# Patient Record
Sex: Female | Born: 1955
Health system: Southern US, Community
[De-identification: ages and names within clinical notes are randomized; demographics above are authoritative.]

## PROBLEM LIST (undated history)

## (undated) DIAGNOSIS — G4700 Insomnia, unspecified: Secondary | ICD-10-CM

## (undated) DIAGNOSIS — E785 Hyperlipidemia, unspecified: Secondary | ICD-10-CM

## (undated) DIAGNOSIS — I10 Essential (primary) hypertension: Secondary | ICD-10-CM

## (undated) HISTORY — PX: ABDOMINAL HYSTERECTOMY: SHX81

## (undated) HISTORY — PX: BACK SURGERY: SHX140

---

## 2005-07-31 ENCOUNTER — Emergency Department: Payer: Self-pay | Admitting: Emergency Medicine

## 2006-11-19 ENCOUNTER — Emergency Department: Payer: Self-pay | Admitting: Emergency Medicine

## 2007-10-08 ENCOUNTER — Emergency Department: Payer: Self-pay | Admitting: Emergency Medicine

## 2008-11-18 ENCOUNTER — Emergency Department: Payer: Self-pay | Admitting: Emergency Medicine

## 2008-12-09 ENCOUNTER — Observation Stay: Payer: Self-pay | Admitting: Internal Medicine

## 2009-01-06 ENCOUNTER — Ambulatory Visit: Payer: Self-pay | Admitting: Internal Medicine

## 2009-01-26 ENCOUNTER — Ambulatory Visit: Payer: Self-pay | Admitting: Internal Medicine

## 2009-09-09 ENCOUNTER — Ambulatory Visit: Payer: Self-pay | Admitting: Internal Medicine

## 2009-11-03 ENCOUNTER — Emergency Department: Payer: Self-pay | Admitting: Emergency Medicine

## 2010-04-27 ENCOUNTER — Ambulatory Visit: Payer: Self-pay | Admitting: Internal Medicine

## 2011-06-17 ENCOUNTER — Emergency Department: Payer: Self-pay | Admitting: Emergency Medicine

## 2011-08-15 ENCOUNTER — Emergency Department: Payer: Self-pay | Admitting: Emergency Medicine

## 2011-08-15 LAB — CBC
HCT: 42.6 % (ref 35.0–47.0)
HGB: 14.3 g/dL (ref 12.0–16.0)
MCHC: 33.7 g/dL (ref 32.0–36.0)
MCV: 92 fL (ref 80–100)
RBC: 4.63 10*6/uL (ref 3.80–5.20)
RDW: 13.7 % (ref 11.5–14.5)

## 2011-08-15 LAB — COMPREHENSIVE METABOLIC PANEL
Albumin: 4 g/dL (ref 3.4–5.0)
Alkaline Phosphatase: 72 U/L (ref 50–136)
BUN: 10 mg/dL (ref 7–18)
Calcium, Total: 9.2 mg/dL (ref 8.5–10.1)
Glucose: 107 mg/dL — ABNORMAL HIGH (ref 65–99)
SGOT(AST): 18 U/L (ref 15–37)
Total Protein: 7.4 g/dL (ref 6.4–8.2)

## 2015-08-15 ENCOUNTER — Ambulatory Visit
Admission: EM | Admit: 2015-08-15 | Discharge: 2015-08-15 | Disposition: A | Discharge status: Home / Self Care | Payer: BLUE CROSS/BLUE SHIELD | Attending: Family Medicine | Admitting: Family Medicine

## 2015-08-15 ENCOUNTER — Encounter: Payer: Self-pay | Admitting: Gynecology

## 2015-08-15 DIAGNOSIS — K0889 Other specified disorders of teeth and supporting structures: Secondary | ICD-10-CM

## 2015-08-15 DIAGNOSIS — K047 Periapical abscess without sinus: Secondary | ICD-10-CM | POA: Diagnosis not present

## 2015-08-15 HISTORY — DX: Hyperlipidemia, unspecified: E78.5

## 2015-08-15 HISTORY — DX: Insomnia, unspecified: G47.00

## 2015-08-15 HISTORY — DX: Essential (primary) hypertension: I10

## 2015-08-15 MED ORDER — PENICILLIN V POTASSIUM 500 MG PO TABS: 500.0000 mg | ORAL_TABLET | Freq: Four times a day (QID) | ORAL | Status: DC

## 2015-08-15 MED ORDER — LIDOCAINE VISCOUS 2 % MT SOLN
15.0000 mL | Freq: Once | OROMUCOSAL | Status: AC
  Administered 2015-08-15: 15 mL via OROMUCOSAL

## 2015-08-15 MED ORDER — IBUPROFEN 600 MG PO TABS: 600.0000 mg | ORAL_TABLET | Freq: Three times a day (TID) | ORAL | Status: DC | PRN

## 2015-08-15 MED ORDER — OXYCODONE-ACETAMINOPHEN 5-325 MG PO TABS: 1.0000 | ORAL_TABLET | Freq: Three times a day (TID) | ORAL | Status: DC | PRN

## 2015-08-15 NOTE — ED Provider Notes (Signed)
[facility]   Time seen: Approximately 11:08 AM  I have reviewed the triage vital signs and the nursing notes.   HISTORY  Chief Complaint Facial Swelling   HPI Julie Maldonado is a 60 y.o. female presents with a complaint of right lower dental pain. Patient reports that she has had right lower dental pain times approximately 1 week. Reports history of similar. Reports that one week ago she was playing with her dog and states that her dog jumped up and its head hit her right lower jaw causing a tooth that had cavity and fillings to chip off more. Patient denies other pain. Denies bony pain. Denies loss of consciousness.Denies pain with opening mouth or eating.  Patient reports that she has had continued pain throughout the week at tooth however states that she woke up this morning swelling to her face around the tooth. Patient reports that the pain is continued about the same level. States current right lower dental pain is 7 out of 10 aching and throbbing. Denies pain radiation. Denies difficulty eating or swallowing. Denies other swelling. Denies fevers. Denies other recent changes.     Past Medical History  Diagnosis Date  . Hyperlipemia   . Hypertension   . Insomnia     There are no active problems to display for this patient.   Past Surgical History  Procedure Laterality Date  . Abdominal hysterectomy    . Back surgery      Current Outpatient Rx  Name  Route  Sig  Dispense  Refill  . aspirin 81 MG tablet   Oral   Take 81 mg by mouth daily.         Marland Kitchen atorvastatin (LIPITOR) 10 MG tablet   Oral   Take 10 mg by mouth daily.         Lisinopril   Allergies Sulfa antibiotics  No family history on file.  Social History Social History  Substance Use Topics  . Smoking status: Current Every Day Smoker  . Smokeless tobacco: None  . Alcohol Use: No    Review of Systems Constitutional: No fever/chills Eyes: No visual changes. ENT: No sore throat. Right  lower dental pain and right facial swelling. Cardiovascular: Denies chest pain. Respiratory: Denies shortness of breath. Gastrointestinal: No abdominal pain.  No nausea, no vomiting.  No diarrhea.  No constipation. Genitourinary: Negative for dysuria. Musculoskeletal: Negative for back pain. Skin: Negative for rash. Neurological: Negative for headaches, focal weakness or numbness.  10-point ROS otherwise negative. ________________________________________   PHYSICAL EXAM:  VITAL SIGNS: ED Triage Vitals  Enc Vitals Group     BP 08/15/15 0957 151/97 mmHg     Pulse Rate 08/15/15 0957 87     Resp 08/15/15 0957 16     Temp 08/15/15 0957 98.1 F (36.7 C)     Temp Source 08/15/15 0957 Oral     SpO2 08/15/15 0957 99 %     Weight 08/15/15 0957 155 lb (70.308 kg)     Height 08/15/15 0957 5\' 5"  (1.651 m)     Head Cir --      Peak Flow --      Pain Score --      Pain Loc --      Pain Edu? --      Excl. in GC? --    Patient reports that she has not yet taken her blood pressure medicines this morning,  states will take [place] as she returns home.  Constitutional: Alert and  oriented. Well appearing and in no acute distress. Eyes: Conjunctivae are normal. PERRL. EOMI. Head: Atraumatic.Mild to moderate right lower facial swelling, no erythema, no induration, no external fluctuance, no swelling induration or pain submandibular. No bony tenderness. Ears:  Bilateral ears no erythema, normal TMs.  Nose: No congestion/rhinnorhea. Mouth/Throat: Mucous membranes are moist.  Oropharynx non-erythematous. No lip, tongue or pharyngeal swelling or edema. Periodontal Exam    patient with widespread dental decay with multiple visualized on caries, dental fracture to right lower tooth #30. Patient also with generalized, erythema and edema along right lower teeth 27 through 30, slight area of fluctuance noted at base of 29 and 30. See diagram. Drinking fluids well in urgent care.  Neck: No stridor.    Hematological/Lymphatic/Immunilogical: No cervical lymphadenopathy. Cardiovascular:   Normal rate, regular rhythm. Grossly normal heart sounds. Good peripheral circulation. Respiratory: Normal respiratory effort.  No retractions. No wheezes, rales or rhonchi.  Musculoskeletal: No lower or upper extremity tenderness nor edema.  No joint effusions. no cervical, thoracic or lumbar tenderness to palpation.  Neurologic:  Normal speech and language. No gross focal neurologic deficits are appreciated. Speech is normal. No gait instability. Skin:  Skin is warm, dry and intact. No rash noted. Psychiatric: Mood and affect are normal. Speech and behavior are normal.  ____________________________________________   LABS (all labs ordered are listed, but only abnormal results are displayed)  Labs Reviewed - No data to display   PROCEDURES   Procedure(s) performed:  Procedure explained and verbal consent obtained. Consent: Verbal consent obtained. Written consent not obtained. Risks and benefits: risks, benefits and alternatives were discussed Patient identity confirmed: verbally with patient and hospital-assigned identification number  Consent given by: patient   I&D abscess Location: Right dental lower Preparation: Patient was prepped and draped in the usual sterile fashion. Anesthesia 15 mL's viscous lidocaine Irrigation solution: saline  Incision made with #11 blade scalpel, a small incision less than 1 cm Moderate purulent drainage immediately obtained with expression.  Patient reports that immediately post I&D reports improved pain. Patient reports that pressure has dramatically improved. Patient tolerate well. Wound well approximated post repair.  dressing applied.  Wound care instructions provided.  Observe for any signs of infection or other problems.    ___________________________   INITIAL IMPRESSION / ASSESSMENT AND PLAN / ED COURSE  Pertinent labs & imaging results that were  available during my care of the patient were reviewed by me and considered in my medical decision making (see chart for details).  Very well-appearing patient. No acute distress. Presents for the complaints of 1 week of right lower dental pain. Patient reports acutely 1 day with facial swelling adjacent inconsistent 2 dental fracture. Right lower gum line with erythema and edema with noted fluctuance. Oral viscous lidocaine utilized. Procedure explained and consent obtained. Small less than 1 cm incision made with immediate purulent return. Patient tolerated this well and reports improved pain and easily after procedure. Will treat with oral antibiotics Pen-VK and when necessary Percocet and patient directed to have close follow-up with dentist. Patient presents this time she does not have dental insurance. Local dental and information and resources given to patient.  Patient was advised to see the dentist this week  Also advised to take the antibiotic until finished. Instructed to return to the  urgent care or symptoms that change or worsen or if unable to schedule an appointment. ____________________________________________   FINAL CLINICAL IMPRESSION(S) / ED DIAGNOSES  Final diagnoses:  Dental abscess  Pain, dental  Renford Dills, NP 08/15/15 1120

## 2015-08-15 NOTE — ED Notes (Signed)
Patient c/o cracked tooth right lower jaw x 1 week.

## 2015-08-15 NOTE — Discharge Instructions (Signed)
Take medication as prescribed. Rinse mouth frequently with arm water. Eat and drink soft food diet.  Follow-up closely with dentist. See dentist within the next week. See below for dental information. Return to urgent care or proceed to ER for increased pain, increased swelling, fever, inability to eat or drink, new or worsening concerns.  Dental Abscess A dental abscess is a collection of pus in or around a tooth. CAUSES This condition is caused by a bacterial infection around the root of the tooth that involves the inner part of the tooth (pulp). It may result from:  Severe tooth decay.  Trauma to the tooth that allows bacteria to enter into the pulp, such as a broken or chipped tooth.  Severe gum disease around a tooth. SYMPTOMS Symptoms of this condition include:  Severe pain in and around the infected tooth.  Swelling and redness around the infected tooth, in the mouth, or in the face.  Tenderness.  Pus drainage.  Bad breath.  Bitter taste in the mouth.  Difficulty swallowing.  Difficulty opening the mouth.  Nausea.  Vomiting.  Chills.  Swollen neck glands.  Fever. DIAGNOSIS This condition is diagnosed with examination of the infected tooth. During the exam, your dentist may tap on the infected tooth. Your dentist will also ask about your medical and dental history and may order X-rays. TREATMENT This condition is treated by eliminating the infection. This may be done with:  Antibiotic medicine.  A root canal. This may be performed to save the tooth.  Pulling (extracting) the tooth. This may also involve draining the abscess. This is done if the tooth cannot be saved. HOME CARE INSTRUCTIONS  Take medicines only as directed by your dentist.  If you were prescribed antibiotic medicine, finish all of it even if you start to feel better.  Rinse your mouth (gargle) often with salt water to relieve pain or swelling.  Do not drive or operate heavy machinery  while taking pain medicine.  Do not apply heat to the outside of your mouth.  Keep all follow-up visits as directed by your dentist. This is important. SEEK MEDICAL CARE IF:  Your pain is worse and is not helped by medicine. SEEK IMMEDIATE MEDICAL CARE IF:  You have a fever or chills.  Your symptoms suddenly get worse.  You have a very bad headache.  You have problems breathing or swallowing.  You have trouble opening your mouth.  You have swelling in your neck or around your eye.   This information is not intended to replace advice given to you by your health care provider. Make sure you discuss any questions you have with your health care provider.   Document Released: 07/18/2005 Document Revised: 12/02/2014 Document Reviewed: 07/15/2014 Elsevier Interactive Patient Education 2016 Elsevier Inc.  Dental Pain Dental pain may be caused by many things, including:  Tooth decay (cavities or caries). Cavities expose the nerve of your tooth to air and hot or cold temperatures. This can cause pain or discomfort.  Abscess or infection. A dental abscess is a collection of infected pus from a bacterial infection in the inner part of the tooth (pulp). It usually occurs at the end of the tooth's root.  Injury.  An unknown reason (idiopathic). Your pain may be mild or severe. It may only occur when:  You are chewing.  You are exposed to hot or cold temperature.  You are eating or drinking sugary foods or beverages, such as soda or candy. Your pain may also  be constant. HOME CARE INSTRUCTIONS Watch your dental pain for any changes. The following actions may help to lessen any discomfort that you are feeling:  Take medicines only as directed by your dentist.  If you were prescribed an antibiotic medicine, finish all of it even if you start to feel better.  Keep all follow-up visits as directed by your dentist. This is important.  Do not apply heat to the outside of your  face.  Rinse your mouth or gargle with salt water if directed by your dentist. This helps with pain and swelling.  You can make salt water by adding  tsp of salt to 1 cup of warm water.  Apply ice to the painful area of your face:  Put ice in a plastic bag.  Place a towel between your skin and the bag.  Leave the ice on for 20 minutes, 2-3 times per day.  Avoid foods or drinks that cause you pain, such as:  Very hot or very cold foods or drinks.  Sweet or sugary foods or drinks. SEEK MEDICAL CARE IF:  Your pain is not controlled with medicines.  Your symptoms are worse.  You have new symptoms. SEEK IMMEDIATE MEDICAL CARE IF:  You are unable to open your mouth.  You are having trouble breathing or swallowing.  You have a fever.  Your face, neck, or jaw is swollen.   This information is not intended to replace advice given to you by your health care provider. Make sure you discuss any questions you have with your health care provider.   Document Released: 07/18/2005 Document Revised: 12/02/2014 Document Reviewed: 07/14/2014 Elsevier Interactive Patient Education 2016 [employer].    OPTIONS FOR DENTAL FOLLOW UP CARE  [place] Department of Health and Human Services - Local Safety Net Dental Clinics [email or web address]   [facility] [phone])  Sharl Ma [phone])  [facility] [phone])  [facility] [phone])  [facility] [phone]) This clinic caters to the indigent population and is on a lottery system. Location: [employer] [facility], [employer], [address], [place] Clinic Hours: Wednesdays from 6pm - 9pm, patients seen by a lottery system. For dates, call or go to [email or web address] Services: Cleanings, fillings and simple extractions. Payment Options: DENTAL WORK IS FREE OF  CHARGE. Bring proof of income or support. Best way to get seen: Arrive at 5:15 pm - this is a lottery, NOT first come/first serve, so arriving earlier will not increase your chances of being seen.     [facility] [phone] Select option 1 for emergencies   Location: [facility], [place], [address] [place] Clinic Hours: No walk-ins accepted - call the day before to schedule an appointment. Check in times are 9:30 am and 1:30 pm. Services: Simple extractions, temporary fillings, pulpectomy/pulp debridement, uncomplicated abscess drainage. Payment Options: PAYMENT IS DUE AT THE TIME OF SERVICE.  Fee is usually $100-200, additional surgical procedures (e.g. abscess drainage) may be extra. Cash, checks, Visa/MasterCard accepted.  Can file Medicaid if patient is covered for dental - patient should call case worker to check. No discount for [facility] patients. Best way to get seen: MUST call the day before and get onto the schedule. Can usually be seen the next 1-2 days. No walk-ins accepted.     [facility] [phone]   Location: [facility] , [address] [place] Clinic Hours: M, W,  Th, F 8am or 1:30pm, Tues 9a or 1:30 - first come/first served. Services: Simple extractions, temporary fillings, uncomplicated abscess drainage.  You do not need to be an [place] resident. Payment Options: PAYMENT IS DUE AT THE TIME OF SERVICE. Dental insurance, otherwise sliding scale - bring proof of income or support. Depending on income and treatment needed, cost is usually $50-200. Best way to get seen: Arrive early as it is first come/first served.     [facility] [phone]   Location: [address] Clinic Hours: Mon-Thu 8a-5p Services: Most basic dental services including extractions and fillings. Payment Options: PAYMENT IS DUE  AT THE TIME OF SERVICE. Sliding scale, up to 50% off - bring proof if income or support. Medicaid with dental option accepted. Best way to get seen: Call to schedule an appointment, can usually be seen within 2 weeks OR they will try to see walk-ins - show up at 8a or 2p (you may have to wait).     [facility] [phone] [place] RESIDENTS ONLY   Location: [facility], [address], [place], [place] [phone] Clinic Hours: By appointment only. Monday - Thursday 8am-5pm, Friday 8am-12pm Services: Cleanings, fillings, extractions. Payment Options: PAYMENT IS DUE AT THE TIME OF SERVICE. Cash, Visa or MasterCard. Sliding scale - $30 minimum per service. Best way to get seen: Come in to office, complete packet and make an appointment - need proof of income or support monies for each household member and proof of [place] residence. Usually takes about a month to get in.     [facility] [phone]   Location: [address] [facility] Hours: Walk-in Urgent Care Dental Services are offered Monday-Friday mornings only. The numbers of emergencies accepted daily is limited to the number of providers available. Maximum 15 - Mondays, Wednesdays & Thursdays Maximum 10 - Tuesdays & Fridays Services: You do not need to be a [place] resident to be seen for a dental emergency. Emergencies are defined as pain, swelling, abnormal bleeding, or dental trauma. Walkins will receive x-rays if needed. NOTE: Dental cleaning is not an emergency. Payment Options: PAYMENT IS DUE AT THE TIME OF SERVICE. Minimum co-pay is $40.00 for uninsured patients. Minimum co-pay is $3.00 for Medicaid with dental coverage. Dental Insurance is accepted and must be presented at time of visit. Medicare does not cover dental. Forms of payment: Cash, credit card, checks. Best way to get seen: If not previously registered with  the clinic, walk-in dental registration begins at 7:15 am and is on a first come/first serve basis. If previously registered with the clinic, call to make an appointment.     [facility] [phone] [place] RESIDENTS ONLY   Location: [address], [place], [place] Clinic Hours: Mon-Thu 10a-2p Services: Extractions only! Payment Options: FREE (donations accepted) - bring proof of income or support Best way to get seen: Call and schedule an appointment OR come at 8am on the 1st Monday of every month (except for holidays) when it is first come/first served.     [facility] [phone]   Location: [address], [place] Clinic Hours: Friday mornings Services, Payment Options, Best way to get seen: Call for info

## 2016-01-10 ENCOUNTER — Encounter: Payer: Self-pay | Admitting: *Deleted

## 2016-01-10 ENCOUNTER — Ambulatory Visit
Admission: EM | Admit: 2016-01-10 | Discharge: 2016-01-10 | Disposition: A | Discharge status: Home / Self Care | Payer: BLUE CROSS/BLUE SHIELD | Attending: Family Medicine | Admitting: Family Medicine

## 2016-01-10 DIAGNOSIS — Z111 Encounter for screening for respiratory tuberculosis: Secondary | ICD-10-CM | POA: Diagnosis not present

## 2016-01-10 MED ORDER — TUBERCULIN PPD 5 UNIT/0.1ML ID SOLN
5.0000 [IU] | Freq: Once | INTRADERMAL | Status: DC
  Administered 2016-01-10: 5 [IU] via INTRADERMAL

## 2016-01-10 NOTE — ED Notes (Signed)
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

## 2016-01-13 ENCOUNTER — Encounter: Payer: Self-pay | Admitting: Family Medicine

## 2016-01-13 ENCOUNTER — Ambulatory Visit (INDEPENDENT_AMBULATORY_CARE_PROVIDER_SITE_OTHER): Payer: BLUE CROSS/BLUE SHIELD | Admitting: Family Medicine

## 2016-01-13 VITALS — BP 130/80 | HR 108 | Ht 66.0 in | Wt 173.0 lb

## 2016-01-13 DIAGNOSIS — E785 Hyperlipidemia, unspecified: Secondary | ICD-10-CM

## 2016-01-13 DIAGNOSIS — I10 Essential (primary) hypertension: Secondary | ICD-10-CM | POA: Diagnosis not present

## 2016-01-13 MED ORDER — LISINOPRIL 10 MG PO TABS: 10.0000 mg | ORAL_TABLET | Freq: Every day | ORAL | Status: DC

## 2016-01-13 MED ORDER — ATORVASTATIN CALCIUM 10 MG PO TABS: 10.0000 mg | ORAL_TABLET | Freq: Every day | ORAL | Status: DC

## 2016-01-13 NOTE — ED Notes (Signed)
Patient returned for PPD Screening today. No Induration. Negative read.

## 2016-01-13 NOTE — Progress Notes (Signed)
Name: Julie Maldonado   MRN: [id number]    DOB: 10/02/1955   Date:01/13/2016       Progress Note  Subjective  Chief Complaint  Chief Complaint  Patient presents with  . Establish Care  . Hypertension    been out of B/P med    Hypertension This is a chronic problem. The current episode started more than 1 year ago. The problem has been waxing and waning since onset. The problem is uncontrolled. Associated symptoms include headaches. Pertinent negatives include no anxiety, blurred vision, chest pain, malaise/fatigue, neck pain, orthopnea, palpitations, peripheral edema, PND, shortness of breath or sweats. There are no associated agents to hypertension. Risk factors for coronary artery disease include dyslipidemia and smoking/tobacco exposure. Past treatments include ACE inhibitors. The current treatment provides mild improvement. There are no compliance problems.  There is no history of angina, kidney disease, CAD/MI, CVA, heart failure, left ventricular hypertrophy, PVD, renovascular disease or retinopathy. There is no history of chronic renal disease or a hypertension causing med.  Hyperlipidemia This is a chronic problem. The current episode started more than 1 year ago. The problem is controlled. Recent lipid tests were reviewed and are normal. She has no history of chronic renal disease, diabetes, hypothyroidism, liver disease, obesity or nephrotic syndrome. There are no known factors aggravating her hyperlipidemia. Pertinent negatives include no chest pain, focal sensory loss, focal weakness, leg pain, myalgias or shortness of breath. The current treatment provides mild improvement of lipids. There are no compliance problems.     No problem-specific assessment & plan notes found for this encounter.   Past Medical History  Diagnosis Date  . Hyperlipemia   . Hypertension   . Insomnia     Past Surgical History  Procedure Laterality Date  . Abdominal hysterectomy    . Back surgery       Family History  Problem Relation Age of Onset  . Cancer Mother   . Diabetes Maternal Grandfather   . Stroke Maternal Grandfather     Social History   Social History  . Marital Status: Legally Separated    Spouse Name: N/A  . Number of Children: N/A  . Years of Education: N/A   Occupational History  . Not on file.   Social History Main Topics  . Smoking status: Current Every Day Smoker  . Smokeless tobacco: Not on file  . Alcohol Use: No  . Drug Use: Not on file  . Sexual Activity: No   Other Topics Concern  . Not on file   Social History Narrative    Allergies  Allergen Reactions  . Sulfa Antibiotics Hives     Review of Systems  Constitutional: Negative for fever, chills, weight loss and malaise/fatigue.  HENT: Negative for ear discharge, ear pain and sore throat.   Eyes: Negative for blurred vision.  Respiratory: Negative for cough, sputum production, shortness of breath and wheezing.   Cardiovascular: Negative for chest pain, palpitations, orthopnea, leg swelling and PND.  Gastrointestinal: Negative for heartburn, nausea, abdominal pain, diarrhea, constipation, blood in stool and melena.  Genitourinary: Negative for dysuria, urgency, frequency and hematuria.  Musculoskeletal: Negative for myalgias, back pain, joint pain and neck pain.  Skin: Negative for rash.  Neurological: Positive for headaches. Negative for dizziness, tingling, sensory change and focal weakness.  Endo/Heme/Allergies: Negative for environmental allergies and polydipsia. Does not bruise/bleed easily.  Psychiatric/Behavioral: Negative for depression and suicidal ideas. The patient is not nervous/anxious and does not have insomnia.  Objective  Filed Vitals:   01/13/16 1444  BP: 130/80  Pulse: 108  Height:  (1.676 m)  Weight: 173 lb (78.472 kg)    Physical Exam  Constitutional: She is well-developed, well-nourished, and in no distress. No distress.  HENT:  Head:  Normocephalic and atraumatic.  Right Ear: External ear normal.  Left Ear: External ear normal.  Nose: Nose normal.  Mouth/Throat: Oropharynx is clear and moist.  Eyes: Conjunctivae and EOM are normal. Pupils are equal, round, and reactive to light. Right eye exhibits no discharge. Left eye exhibits no discharge.  Neck: Normal range of motion. Neck supple. No JVD present. No thyromegaly present.  Cardiovascular: Normal rate, regular rhythm, normal heart sounds and intact distal pulses.  Exam reveals no gallop and no friction rub.   No murmur heard. Pulmonary/Chest: Effort normal and breath sounds normal. She has no wheezes. She has no rales.  Abdominal: Soft. Bowel sounds are normal. She exhibits no mass. There is no tenderness. There is no guarding.  Musculoskeletal: Normal range of motion. She exhibits no edema.  Lymphadenopathy:    She has no cervical adenopathy.  Neurological: She is alert.  Skin: Skin is warm and dry. She is not diaphoretic.  Psychiatric: Mood and affect normal.  Nursing note and vitals reviewed.     Assessment & Plan  Problem List Items Addressed This Visit    None    Visit Diagnoses    Essential hypertension    -  Primary    Relevant Medications    atorvastatin (LIPITOR) 10 MG tablet    lisinopril (PRINIVIL,ZESTRIL) 10 MG tablet    Hyperlipidemia        Relevant Medications    atorvastatin (LIPITOR) 10 MG tablet    lisinopril (PRINIVIL,ZESTRIL) 10 MG tablet         Dr. [facility]  01/13/2016

## 2016-04-12 ENCOUNTER — Ambulatory Visit: Payer: BLUE CROSS/BLUE SHIELD | Admitting: Family Medicine

## 2016-04-20 ENCOUNTER — Ambulatory Visit (INDEPENDENT_AMBULATORY_CARE_PROVIDER_SITE_OTHER): Payer: BLUE CROSS/BLUE SHIELD | Admitting: Family Medicine

## 2016-04-20 ENCOUNTER — Encounter: Payer: Self-pay | Admitting: Family Medicine

## 2016-04-20 VITALS — BP 138/82 | HR 64 | Ht 66.0 in | Wt 173.0 lb

## 2016-04-20 DIAGNOSIS — F17219 Nicotine dependence, cigarettes, with unspecified nicotine-induced disorders: Secondary | ICD-10-CM | POA: Diagnosis not present

## 2016-04-20 DIAGNOSIS — E785 Hyperlipidemia, unspecified: Secondary | ICD-10-CM | POA: Diagnosis not present

## 2016-04-20 DIAGNOSIS — I10 Essential (primary) hypertension: Secondary | ICD-10-CM | POA: Diagnosis not present

## 2016-04-20 DIAGNOSIS — J0100 Acute maxillary sinusitis, unspecified: Secondary | ICD-10-CM

## 2016-04-20 MED ORDER — AMOXICILLIN 500 MG PO CAPS
500.0000 mg | ORAL_CAPSULE | Freq: Three times a day (TID) | ORAL | 0 refills | Status: DC
Start: 2016-04-20 — End: 2016-12-02

## 2016-04-20 MED ORDER — ATORVASTATIN CALCIUM 10 MG PO TABS: 10.0000 mg | ORAL_TABLET | Freq: Every day | ORAL | 5 refills | Status: DC

## 2016-04-20 MED ORDER — LISINOPRIL 10 MG PO TABS: 10.0000 mg | ORAL_TABLET | Freq: Every day | ORAL | 5 refills | Status: DC

## 2016-04-20 NOTE — Progress Notes (Signed)
Name: Julie Maldonado   MRN: [id number]    DOB: 12/16/1955   Date:04/20/2016       Progress Note  Subjective  Chief Complaint  Chief Complaint  Patient presents with  . Hypertension  . Hyperlipidemia  . Sinusitis    cough and cong- clear/ thick production, drainage    Hypertension  This is a chronic problem. The current episode started more than 1 year ago. The problem has been gradually improving since onset. The problem is controlled. Pertinent negatives include no anxiety, blurred vision, chest pain, headaches, malaise/fatigue, neck pain, orthopnea, palpitations, peripheral edema, PND, shortness of breath or sweats. There are no associated agents to hypertension. There are no known risk factors for coronary artery disease. The current treatment provides moderate improvement. There are no compliance problems.  There is no history of renovascular disease. There is no history of chronic renal disease or a hypertension causing med.  Hyperlipidemia  This is a chronic problem. The current episode started more than 1 year ago. The problem is controlled. Recent lipid tests were reviewed and are normal. She has no history of chronic renal disease, diabetes, hypothyroidism, liver disease, obesity or nephrotic syndrome. There are no known factors aggravating her hyperlipidemia. Pertinent negatives include no chest pain, focal weakness, myalgias or shortness of breath. Current antihyperlipidemic treatment includes statins. The current treatment provides mild improvement of lipids. There are no compliance problems.  There are no known risk factors for coronary artery disease.  Sinusitis  This is a new problem. The current episode started in the past 7 days. The problem has been gradually worsening since onset. There has been no fever. Associated symptoms include congestion, coughing, sinus pressure and sneezing. Pertinent negatives include no chills, diaphoresis, ear pain, headaches, hoarse voice, neck pain,  shortness of breath, sore throat or swollen glands. Past treatments include acetaminophen and oral decongestants. The treatment provided mild relief.    No problem-specific Assessment & Plan notes found for this encounter.   Past Medical History:  Diagnosis Date  . Hyperlipemia   . Hypertension   . Insomnia     Past Surgical History:  Procedure Laterality Date  . ABDOMINAL HYSTERECTOMY    . BACK SURGERY      Family History  Problem Relation Age of Onset  . Cancer Mother   . Diabetes Maternal Grandfather   . Stroke Maternal Grandfather     Social History   Social History  . Marital status: Legally Separated    Spouse name: N/A  . Number of children: N/A  . Years of education: N/A   Occupational History  . Not on file.   Social History Main Topics  . Smoking status: Current Every Day Smoker  . Smokeless tobacco: Not on file  . Alcohol use No  . Drug use: Unknown  . Sexual activity: No   Other Topics Concern  . Not on file   Social History Narrative  . No narrative on file    Allergies  Allergen Reactions  . Sulfa Antibiotics Hives     Review of Systems  Constitutional: Negative for chills, diaphoresis, fever, malaise/fatigue and weight loss.  HENT: Positive for congestion, sinus pressure and sneezing. Negative for ear discharge, ear pain, hoarse voice and sore throat.   Eyes: Negative for blurred vision.  Respiratory: Positive for cough. Negative for sputum production, shortness of breath and wheezing.   Cardiovascular: Negative for chest pain, palpitations, orthopnea, leg swelling and PND.  Gastrointestinal: Negative for abdominal pain, blood in  stool, constipation, diarrhea, heartburn, melena and nausea.  Genitourinary: Negative for dysuria, frequency, hematuria and urgency.  Musculoskeletal: Negative for back pain, joint pain, myalgias and neck pain.  Skin: Negative for rash.  Neurological: Negative for dizziness, tingling, sensory change, focal  weakness and headaches.  Endo/Heme/Allergies: Negative for environmental allergies and polydipsia. Does not bruise/bleed easily.  Psychiatric/Behavioral: Negative for depression and suicidal ideas. The patient is not nervous/anxious and does not have insomnia.      Objective  Vitals:   04/20/16 0754  BP: 138/82  Pulse: 64  Weight: 173 lb (78.5 kg)  Height: 5\' 6"  (1.676 m)    Physical Exam  Constitutional: She is well-developed, well-nourished, and in no distress. No distress.  HENT:  Head: Normocephalic and atraumatic.  Right Ear: External ear normal.  Left Ear: External ear normal.  Nose: Nose normal.  Mouth/Throat: Oropharynx is clear and moist.  Eyes: Conjunctivae and EOM are normal. Pupils are equal, round, and reactive to light. Right eye exhibits no discharge. Left eye exhibits no discharge.  Neck: Normal range of motion. Neck supple. No JVD present. No thyromegaly present.  Cardiovascular: Normal rate, regular rhythm, normal heart sounds and intact distal pulses.  Exam reveals no gallop and no friction rub.   No murmur heard. Pulmonary/Chest: Effort normal and breath sounds normal.  Abdominal: Soft. Bowel sounds are normal. She exhibits no mass. There is no tenderness. There is no guarding.  Musculoskeletal: Normal range of motion. She exhibits no edema.  Lymphadenopathy:    She has no cervical adenopathy.  Neurological: She is alert. She has normal reflexes.  Skin: Skin is warm and dry. She is not diaphoretic.  Psychiatric: Mood and affect normal.  Nursing note and vitals reviewed.     Assessment & Plan  Problem List Items Addressed This Visit      Cardiovascular and Mediastinum   Essential hypertension - Primary   Relevant Medications   lisinopril (PRINIVIL,ZESTRIL) 10 MG tablet   atorvastatin (LIPITOR) 10 MG tablet   Other Relevant Orders   Renal Function Panel     Respiratory   Acute maxillary sinusitis   Relevant Medications   amoxicillin (AMOXIL)  500 MG capsule     Nervous and Auditory   Cigarette nicotine dependence with nicotine-induced disorder     Other   Hyperlipidemia   Relevant Medications   lisinopril (PRINIVIL,ZESTRIL) 10 MG tablet   atorvastatin (LIPITOR) 10 MG tablet   Other Relevant Orders   Lipid Profile    Other Visit Diagnoses   None.    25 minutes discussion/counseling   Dr. Elizabeth Sauer [facility]  04/20/16

## 2016-04-20 NOTE — Patient Instructions (Signed)
Smoking Cessation, Tips for Success If you are ready to quit smoking, congratulations! You have chosen to help yourself be healthier. Cigarettes bring nicotine, tar, carbon monoxide, and other irritants into your body. Your lungs, heart, and blood vessels will be able to work better without these poisons. There are many different ways to quit smoking. Nicotine gum, nicotine patches, a nicotine inhaler, or nicotine nasal spray can help with physical craving. Hypnosis, support groups, and medicines help break the habit of smoking. WHAT THINGS CAN I DO TO MAKE QUITTING EASIER?  Here are some tips to help you quit for good:  Pick a date when you will quit smoking completely. Tell all of your friends and family about your plan to quit on that date.  Do not try to slowly cut down on the number of cigarettes you are smoking. Pick a quit date and quit smoking completely starting on that day.  Throw away all cigarettes.   Clean and remove all ashtrays from your home, work, and car.  On a card, write down your reasons for quitting. Carry the card with you and read it when you get the urge to smoke.  Cleanse your body of nicotine. Drink enough water and fluids to keep your urine clear or pale yellow. Do this after quitting to flush the nicotine from your body.  Learn to predict your moods. Do not let a bad situation be your excuse to have a cigarette. Some situations in your life might tempt you into wanting a cigarette.  Never have "just one" cigarette. It leads to wanting another and another. Remind yourself of your decision to quit.  Change habits associated with smoking. If you smoked while driving or when feeling stressed, try other activities to replace smoking. Stand up when drinking your coffee. Brush your teeth after eating. Sit in a different chair when you read the paper. Avoid alcohol while trying to quit, and try to drink fewer caffeinated beverages. Alcohol and caffeine may urge you to  smoke.  Avoid foods and drinks that can trigger a desire to smoke, such as sugary or spicy foods and alcohol.  Ask people who smoke not to smoke around you.  Have something planned to do right after eating or having a cup of coffee. For example, plan to take a walk or exercise.  Try a relaxation exercise to calm you down and decrease your stress. Remember, you may be tense and nervous for the first 2 weeks after you quit, but this will pass.  Find new activities to keep your hands busy. Play with a pen, coin, or rubber band. Doodle or draw things on paper.  Brush your teeth right after eating. This will help cut down on the craving for the taste of tobacco after meals. You can also try mouthwash.   Use oral substitutes in place of cigarettes. Try using lemon drops, carrots, cinnamon sticks, or chewing gum. Keep them handy so they are available when you have the urge to smoke.  When you have the urge to smoke, try deep breathing.  Designate your home as a nonsmoking area.  If you are a heavy smoker, ask your health care provider about a prescription for nicotine chewing gum. It can ease your withdrawal from nicotine.  Reward yourself. Set aside the cigarette money you save and buy yourself something nice.  Look for support from others. Join a support group or smoking cessation program. Ask someone at home or at work to help you with your plan   to quit smoking.  Always ask yourself, "Do I need this cigarette or is this just a reflex?" Tell yourself, "Today, I choose not to smoke," or "I do not want to smoke." You are reminding yourself of your decision to quit.  Do not replace cigarette smoking with electronic cigarettes (commonly called e-cigarettes). The safety of e-cigarettes is unknown, and some may contain harmful chemicals.  If you relapse, do not give up! Plan ahead and think about what you will do the next time you get the urge to smoke. HOW WILL I FEEL WHEN I QUIT SMOKING? You  may have symptoms of withdrawal because your body is used to nicotine (the addictive substance in cigarettes). You may crave cigarettes, be irritable, feel very hungry, cough often, get headaches, or have difficulty concentrating. The withdrawal symptoms are only temporary. They are strongest when you first quit but will go away within 10-14 days. When withdrawal symptoms occur, stay in control. Think about your reasons for quitting. Remind yourself that these are signs that your body is healing and getting used to being without cigarettes. Remember that withdrawal symptoms are easier to treat than the major diseases that smoking can cause.  Even after the withdrawal is over, expect periodic urges to smoke. However, these cravings are generally short lived and will go away whether you smoke or not. Do not smoke! WHAT RESOURCES ARE AVAILABLE TO HELP ME QUIT SMOKING? Your health care provider can direct you to community resources or hospitals for support, which may include:  Group support.  Education.  Hypnosis.  Therapy.   This information is not intended to replace advice given to you by your health care provider. Make sure you discuss any questions you have with your health care provider.   Document Released: 04/15/2004 Document Revised: 08/08/2014 Document Reviewed: 01/03/2013 Elsevier Interactive Patient Education 2016 [employer]  

## 2016-04-21 LAB — RENAL FUNCTION PANEL
Albumin: 4.4 g/dL (ref 3.6–4.8)
BUN/Creatinine Ratio: 17 (ref 12–28)
BUN: 18 mg/dL (ref 8–27)
CALCIUM: 9.5 mg/dL (ref 8.7–10.3)
CHLORIDE: 102 mmol/L (ref 96–106)
CO2: 23 mmol/L (ref 18–29)
Creatinine, Ser: 1.03 mg/dL — ABNORMAL HIGH (ref 0.57–1.00)
GFR calc Af Amer: 68 mL/min/{1.73_m2} (ref >59)
GFR, EST NON AFRICAN AMERICAN: 59 mL/min/{1.73_m2} — AB (ref >59)
GLUCOSE: 120 mg/dL — AB (ref 65–99)
PHOSPHORUS: 3.1 mg/dL (ref 2.5–4.5)
POTASSIUM: 4.4 mmol/L (ref 3.5–5.2)
SODIUM: 140 mmol/L (ref 134–144)

## 2016-04-21 LAB — LIPID PANEL
CHOL/HDL RATIO: 4.3 ratio (ref 0.0–4.4)
Cholesterol, Total: 215 mg/dL — ABNORMAL HIGH (ref 100–199)
HDL: 50 mg/dL (ref >39)
LDL Calculated: 131 mg/dL — ABNORMAL HIGH (ref 0–99)
TRIGLYCERIDES: 168 mg/dL — AB (ref 0–149)
VLDL Cholesterol Cal: 34 mg/dL (ref 5–40)

## 2016-12-02 ENCOUNTER — Ambulatory Visit
Admission: EM | Admit: 2016-12-02 | Discharge: 2016-12-02 | Disposition: A | Discharge status: Home / Self Care | Payer: BLUE CROSS/BLUE SHIELD | Attending: Emergency Medicine | Admitting: Emergency Medicine

## 2016-12-02 ENCOUNTER — Encounter: Payer: Self-pay | Admitting: Emergency Medicine

## 2016-12-02 ENCOUNTER — Ambulatory Visit (INDEPENDENT_AMBULATORY_CARE_PROVIDER_SITE_OTHER): Payer: BLUE CROSS/BLUE SHIELD

## 2016-12-02 DIAGNOSIS — M23304 Other meniscus derangements, unspecified medial meniscus, left knee: Secondary | ICD-10-CM | POA: Diagnosis not present

## 2016-12-02 DIAGNOSIS — M25562 Pain in left knee: Secondary | ICD-10-CM | POA: Diagnosis not present

## 2016-12-02 MED ORDER — TRAMADOL HCL 50 MG PO TABS: 50.0000 mg | ORAL_TABLET | Freq: Four times a day (QID) | ORAL | 0 refills | Status: DC | PRN

## 2016-12-02 MED ORDER — IBUPROFEN 600 MG PO TABS: 600.0000 mg | ORAL_TABLET | Freq: Four times a day (QID) | ORAL | 0 refills | Status: DC | PRN

## 2016-12-02 NOTE — Discharge Instructions (Signed)
Get a cane to help you walk so that you do not injure other parts of your body by overcompensating. Try an Ace wrap or a knee sleeve for compression as needed. 600 mg ibuprofen with 1 g of Tylenol 3-4 times a day as needed for pain. Tramadol for severe pain only. Follow Up with orthopedics in a week. Go to the ER for fevers above 100.4, pain not controlled medications, if your foot or leg turns white, blue or further concerns

## 2016-12-02 NOTE — ED Triage Notes (Signed)
Patient c/o left knee pain that started couple of weeks ago. Patient states that her left knee popped which made her knee hurt worse and started having numbness in her left foot.

## 2016-12-02 NOTE — ED Provider Notes (Signed)
HPI  SUBJECTIVE:  Julie Maldonado is a 61 y.o. female who presents with acute worsening of her left knee pain that she has had for a while. Patient states that she had her foot planted, pivoted, placing torque on her knee and she felt a "pop". This occurred earlier today. States that she felt immediate foot tingling, which resolved after several minutes, and states that should she was unable to bear weight on it immediately after because it felt like it was going to give way. She now reports constant, achy pain, more intense than her usual aches and pains. States it is located along the medial joint. She reports some swelling, but no redness, limitation of motion, clicking, giving way. No direct trauma to her knee. No numbness or leg weakness. She is not tried anything for this. Symptoms better with walking around, movement, worse with sitting for prolonged periods of time or placing torque on the knee. She has a past medical history of hypertension. No history of diabetes, kidney disease, GI bleed, left knee injury. PMD: Elizabeth Sauereanna Jones, M.D.    Past Medical History:  Diagnosis Date  . Hyperlipemia   . Hypertension   . Insomnia     Past Surgical History:  Procedure Laterality Date  . ABDOMINAL HYSTERECTOMY    . BACK SURGERY      Family History  Problem Relation Age of Onset  . Cancer Mother   . Diabetes Maternal Grandfather   . Stroke Maternal Grandfather     Social History  Substance Use Topics  . Smoking status: Current Every Day Smoker  . Smokeless tobacco: Never Used  . Alcohol use No    No current facility-administered medications for this encounter.   Current Outpatient Prescriptions:  .  omega-3 acid ethyl esters (LOVAZA) 1 g capsule, Take by mouth 2 (two) times daily., Disp: , Rfl:  .  aspirin 81 MG tablet, Take 81 mg by mouth daily., Disp: , Rfl:  .  atorvastatin (LIPITOR) 10 MG tablet, Take 1 tablet (10 mg total) by mouth daily., Disp: 30 tablet, Rfl: 5 .  ibuprofen  (ADVIL,MOTRIN) 600 MG tablet, Take 1 tablet (600 mg total) by mouth every 6 (six) hours as needed., Disp: 30 tablet, Rfl: 0 .  lisinopril (PRINIVIL,ZESTRIL) 10 MG tablet, Take 1 tablet (10 mg total) by mouth daily., Disp: 30 tablet, Rfl: 5 .  traMADol (ULTRAM) 50 MG tablet, Take 1 tablet (50 mg total) by mouth every 6 (six) hours as needed., Disp: 20 tablet, Rfl: 0  Allergies  Allergen Reactions  . Sulfa Antibiotics Hives     ROS  As noted in HPI.   Physical Exam  BP (!) 165/89 (BP Location: Left Arm)   Pulse 76   Temp 98.2 F (36.8 C) (Oral)   Resp 16   Ht 5\' 6"  (1.676 m)   Wt 178 lb (80.7 kg)   SpO2 97%   BMI 28.73 kg/m   Constitutional: Well developed, well nourished, no acute distress Eyes:  EOMI, conjunctiva normal bilaterally HENT: Normocephalic, atraumatic,mucus membranes moist Respiratory: Normal inspiratory effort Cardiovascular: Normal rate GI: nondistended skin: No rash, skin intact Musculoskeletal: L Knee ROM baseline for PT , Flexion/extension  Intact,  Patella NT,  Patellar tendon NT, Medial joint tender, Lateral joint NT, positive tenderness posterior horn medial meniscus, otherwise Popliteal region NT, Lachman's stable, Varus LCL stress testing stable, Valgus MCL stress testing stable, McMurray's testing abnormal, distal NVI with intact baseline sensation / motor / pulse distal to knee .  No effusion. No erythema. No increased temperature.  Neurologic: Alert & oriented x 3, no focal neuro deficits Psychiatric: Speech and behavior appropriate   ED Course   Medications - No data to display  Orders Placed This Encounter  Procedures  . DG Knee Complete 4 Views Left    Standing Status:   Standing    Number of Occurrences:   1    Order Specific Question:   Reason for Exam (SYMPTOM  OR DIAGNOSIS REQUIRED)    Answer:   Left knee pain    No results found for this or any previous visit (from the past 24 hour(s)). Dg Knee Complete 4 Views Left  Result Date:  12/02/2016 CLINICAL DATA:  Chronic left knee pain without known injury. EXAM: LEFT KNEE - COMPLETE 4+ VIEW COMPARISON:  Radiographs of November 03, 2009. FINDINGS: No evidence of fracture, dislocation, or joint effusion. Minimal narrowing of medial joint space is noted. Soft tissues are unremarkable. IMPRESSION: Minimal degenerative joint disease is noted medially. No acute abnormality seen in the left knee. Electronically Signed   By: Lupita Raider, M.D.   On: 12/02/2016 16:09    ED Clinical Impression  Acute pain of left knee  Derangement of medial meniscus of left knee   ED Assessment/Plan  Imaging and it reviewed independently. No fracture, dislocation, effusion. See radiology report for details.   presentation most consistent with a meniscal injury. Home with cane, compression dressing of her choice, Tylenol 1 g with 600 mg ibuprofen 3-4 times daily as needed for pain, tramadol for severe pain, follow up with Dr. Joice Lofts, orthopedics on call for comprehensive evaluation and imaging. Discussed  imaging, MDM, plan and followup with patient  Discussed sn/sx that should prompt return to the ED. Patient agrees with plan.   Meds ordered this encounter  Medications  . omega-3 acid ethyl esters (LOVAZA) 1 g capsule    Sig: Take by mouth 2 (two) times daily.  Marland Kitchen ibuprofen (ADVIL,MOTRIN) 600 MG tablet    Sig: Take 1 tablet (600 mg total) by mouth every 6 (six) hours as needed.    Dispense:  30 tablet    Refill:  0  . traMADol (ULTRAM) 50 MG tablet    Sig: Take 1 tablet (50 mg total) by mouth every 6 (six) hours as needed.    Dispense:  20 tablet    Refill:  0    *This clinic note was created using Scientist, clinical (histocompatibility and immunogenetics). Therefore, there may be occasional mistakes despite careful proofreading.  ?   Domenick Gong, MD 12/02/16 [phone]

## 2017-01-18 ENCOUNTER — Other Ambulatory Visit: Payer: Self-pay | Admitting: Family Medicine

## 2017-01-18 DIAGNOSIS — I10 Essential (primary) hypertension: Secondary | ICD-10-CM

## 2017-01-18 DIAGNOSIS — E785 Hyperlipidemia, unspecified: Secondary | ICD-10-CM

## 2017-03-09 ENCOUNTER — Other Ambulatory Visit: Payer: Self-pay | Admitting: Family Medicine

## 2017-03-09 DIAGNOSIS — I10 Essential (primary) hypertension: Secondary | ICD-10-CM

## 2017-03-09 DIAGNOSIS — E785 Hyperlipidemia, unspecified: Secondary | ICD-10-CM

## 2017-03-10 ENCOUNTER — Other Ambulatory Visit: Payer: Self-pay

## 2017-03-10 DIAGNOSIS — I10 Essential (primary) hypertension: Secondary | ICD-10-CM

## 2017-03-10 DIAGNOSIS — E7800 Pure hypercholesterolemia, unspecified: Secondary | ICD-10-CM

## 2017-03-10 MED ORDER — LISINOPRIL 10 MG PO TABS
10.0000 mg | ORAL_TABLET | Freq: Every day | ORAL | 0 refills | Status: DC
Start: 2017-03-10 — End: 2017-03-22

## 2017-03-10 MED ORDER — ATORVASTATIN CALCIUM 10 MG PO TABS: 10.0000 mg | ORAL_TABLET | Freq: Every day | ORAL | 0 refills | Status: DC

## 2017-03-22 ENCOUNTER — Ambulatory Visit (INDEPENDENT_AMBULATORY_CARE_PROVIDER_SITE_OTHER): Payer: BLUE CROSS/BLUE SHIELD | Admitting: Family Medicine

## 2017-03-22 ENCOUNTER — Encounter: Payer: Self-pay | Admitting: Family Medicine

## 2017-03-22 VITALS — BP 134/92 | HR 80 | Ht 66.0 in | Wt 172.0 lb

## 2017-03-22 DIAGNOSIS — E7800 Pure hypercholesterolemia, unspecified: Secondary | ICD-10-CM

## 2017-03-22 DIAGNOSIS — F17210 Nicotine dependence, cigarettes, uncomplicated: Secondary | ICD-10-CM

## 2017-03-22 DIAGNOSIS — Z114 Encounter for screening for human immunodeficiency virus [HIV]: Secondary | ICD-10-CM | POA: Diagnosis not present

## 2017-03-22 DIAGNOSIS — Z1211 Encounter for screening for malignant neoplasm of colon: Secondary | ICD-10-CM | POA: Diagnosis not present

## 2017-03-22 DIAGNOSIS — Z1239 Encounter for other screening for malignant neoplasm of breast: Secondary | ICD-10-CM

## 2017-03-22 DIAGNOSIS — Z1231 Encounter for screening mammogram for malignant neoplasm of breast: Secondary | ICD-10-CM | POA: Diagnosis not present

## 2017-03-22 DIAGNOSIS — Z1159 Encounter for screening for other viral diseases: Secondary | ICD-10-CM | POA: Diagnosis not present

## 2017-03-22 DIAGNOSIS — Z23 Encounter for immunization: Secondary | ICD-10-CM

## 2017-03-22 DIAGNOSIS — I10 Essential (primary) hypertension: Secondary | ICD-10-CM

## 2017-03-22 MED ORDER — OMEGA 3 1000 MG PO CAPS: 1.0000 | ORAL_CAPSULE | Freq: Two times a day (BID) | ORAL | 3 refills | Status: DC

## 2017-03-22 MED ORDER — LISINOPRIL-HYDROCHLOROTHIAZIDE 10-12.5 MG PO TABS: 1.0000 | ORAL_TABLET | Freq: Every day | ORAL | 1 refills | Status: DC

## 2017-03-22 MED ORDER — ATORVASTATIN CALCIUM 10 MG PO TABS: 10.0000 mg | ORAL_TABLET | Freq: Every day | ORAL | 1 refills | Status: DC

## 2017-03-22 NOTE — Progress Notes (Signed)
Name: Julie Maldonado   MRN: [id number]    DOB: 12/28/1955   Date:03/22/2017       Progress Note  Subjective  Chief Complaint  Chief Complaint  Patient presents with  . Hypertension  . Hyperlipidemia    Hypertension  This is a new problem. The current episode started more than 1 year ago. The problem has been waxing and waning since onset. The problem is uncontrolled. Pertinent negatives include no anxiety, blurred vision, chest pain, headaches, malaise/fatigue, neck pain, orthopnea, palpitations, peripheral edema, PND, shortness of breath or sweats. There are no associated agents to hypertension. Risk factors for coronary artery disease include dyslipidemia, obesity and smoking/tobacco exposure. Past treatments include ACE inhibitors. The current treatment provides mild improvement. There are no compliance problems.  There is no history of angina, kidney disease, CAD/MI, CVA, heart failure, left ventricular hypertrophy, PVD or retinopathy. There is no history of chronic renal disease, a hypertension causing med or renovascular disease.  Hyperlipidemia  This is a chronic problem. The current episode started more than 1 year ago. The problem is controlled. Recent lipid tests were reviewed and are normal. She has no history of chronic renal disease, diabetes, liver disease or obesity. Pertinent negatives include no chest pain, focal sensory loss, focal weakness, leg pain, myalgias or shortness of breath. Current antihyperlipidemic treatment includes statins. The current treatment provides mild improvement of lipids. There are no compliance problems.  Risk factors for coronary artery disease include hypertension.    No problem-specific Assessment & Plan notes found for this encounter.   Past Medical History:  Diagnosis Date  . Hyperlipemia   . Hypertension   . Insomnia     Past Surgical History:  Procedure Laterality Date  . ABDOMINAL HYSTERECTOMY    . BACK SURGERY      Family History   Problem Relation Age of Onset  . Cancer Mother   . Diabetes Maternal Grandfather   . Stroke Maternal Grandfather     Social History   Social History  . Marital status: Legally Separated    Spouse name: N/A  . Number of children: N/A  . Years of education: N/A   Occupational History  . Not on file.   Social History Main Topics  . Smoking status: Current Every Day Smoker  . Smokeless tobacco: Never Used  . Alcohol use No  . Drug use: No  . Sexual activity: No   Other Topics Concern  . Not on file   Social History Narrative  . No narrative on file    Allergies  Allergen Reactions  . Sulfa Antibiotics Hives    Outpatient Medications Prior to Visit  Medication Sig Dispense Refill  . aspirin 81 MG tablet Take 81 mg by mouth daily.    Marland Kitchen atorvastatin (LIPITOR) 10 MG tablet Take 1 tablet (10 mg total) by mouth daily. 15 tablet 0  . ibuprofen (ADVIL,MOTRIN) 600 MG tablet Take 1 tablet (600 mg total) by mouth every 6 (six) hours as needed. 30 tablet 0  . lisinopril (PRINIVIL,ZESTRIL) 10 MG tablet Take 1 tablet (10 mg total) by mouth daily. 15 tablet 0  . omega-3 acid ethyl esters (LOVAZA) 1 g capsule Take by mouth 2 (two) times daily.    . traMADol (ULTRAM) 50 MG tablet Take 1 tablet (50 mg total) by mouth every 6 (six) hours as needed. 20 tablet 0   No facility-administered medications prior to visit.     Review of Systems  Constitutional: Negative for chills, fever,  malaise/fatigue and weight loss.  HENT: Negative for ear discharge, ear pain and sore throat.   Eyes: Negative for blurred vision.  Respiratory: Negative for cough, sputum production, shortness of breath and wheezing.   Cardiovascular: Negative for chest pain, palpitations, orthopnea, leg swelling and PND.  Gastrointestinal: Negative for abdominal pain, blood in stool, constipation, diarrhea, heartburn, melena and nausea.  Genitourinary: Negative for dysuria, frequency, hematuria and urgency.   Musculoskeletal: Negative for back pain, joint pain, myalgias and neck pain.  Skin: Negative for rash.  Neurological: Negative for dizziness, tingling, sensory change, focal weakness and headaches.  Endo/Heme/Allergies: Negative for environmental allergies and polydipsia. Does not bruise/bleed easily.  Psychiatric/Behavioral: Negative for depression and suicidal ideas. The patient is not nervous/anxious and does not have insomnia.      Objective  Vitals:   03/22/17 0844  BP: (!) 134/92  Pulse: 80  Weight: 172 lb (78 kg)  Height: 5\' 6"  (1.676 m)    Physical Exam  Constitutional: She is well-developed, well-nourished, and in no distress. No distress.  HENT:  Head: Normocephalic and atraumatic.  Right Ear: External ear normal.  Left Ear: External ear normal.  Nose: Nose normal.  Mouth/Throat: Oropharynx is clear and moist.  Eyes: Pupils are equal, round, and reactive to light. Conjunctivae and EOM are normal. Right eye exhibits no discharge. Left eye exhibits no discharge.  Neck: Normal range of motion. Neck supple. No JVD present. No thyromegaly present.  Cardiovascular: Normal rate, regular rhythm, normal heart sounds and intact distal pulses.  Exam reveals no gallop and no friction rub.   No murmur heard. Pulmonary/Chest: Effort normal and breath sounds normal. She has no wheezes. She has no rales. Right breast exhibits no inverted nipple, no mass, no nipple discharge, no skin change and no tenderness. Left breast exhibits no inverted nipple, no mass, no nipple discharge, no skin change and no tenderness. Breasts are symmetrical.  Abdominal: Soft. Bowel sounds are normal. She exhibits no mass. There is no hepatosplenomegaly. There is no tenderness. There is no guarding and no CVA tenderness.  Musculoskeletal: Normal range of motion. She exhibits no edema.  Lymphadenopathy:       Head (right side): No submandibular adenopathy present.       Head (left side): No submandibular  adenopathy present.    She has no cervical adenopathy.  Neurological: She is alert. She has normal strength, normal reflexes and intact cranial nerves.  Skin: Skin is warm and dry. She is not diaphoretic.  Psychiatric: Mood and affect normal.  Nursing note and vitals reviewed.     Assessment & Plan  Problem List Items Addressed This Visit      Cardiovascular and Mediastinum   Essential hypertension - Primary   Relevant Medications   atorvastatin (LIPITOR) 10 MG tablet   lisinopril-hydrochlorothiazide (PRINZIDE,ZESTORETIC) 10-12.5 MG tablet   Other Relevant Orders   Renal Function Panel     Other   Hyperlipidemia   Relevant Medications   atorvastatin (LIPITOR) 10 MG tablet   lisinopril-hydrochlorothiazide (PRINZIDE,ZESTORETIC) 10-12.5 MG tablet   Omega 3 1000 MG CAPS   Other Relevant Orders   Lipid Profile    Other Visit Diagnoses    Breast screening       Relevant Orders   MM Digital Screening   Colon cancer screening       Relevant Orders   Ambulatory referral to Gastroenterology   Need for diphtheria-tetanus-pertussis (Tdap) vaccine       Relevant Orders   Tdap vaccine greater  than or equal to 7yo IM (Completed)   Need for hepatitis C screening test       Relevant Orders   Hepatitis C antibody   Encounter for screening for HIV       Relevant Orders   HIV antibody   Cigarette nicotine dependence without complication          Meds ordered this encounter  Medications  . atorvastatin (LIPITOR) 10 MG tablet    Sig: Take 1 tablet (10 mg total) by mouth daily.    Dispense:  90 tablet    Refill:  1    sched appt to be seen  . lisinopril-hydrochlorothiazide (PRINZIDE,ZESTORETIC) 10-12.5 MG tablet    Sig: Take 1 tablet by mouth daily.    Dispense:  90 tablet    Refill:  1  . Omega 3 1000 MG CAPS    Sig: Take 1 capsule (1,000 mg total) by mouth 2 (two) times daily.    Dispense:  90 each    Refill:  3      Dr. Elizabeth Sauer [facility]  03/22/17

## 2017-03-22 NOTE — Patient Instructions (Signed)
Calorie Counting for Weight Loss Calories are units of energy. Your body needs a certain amount of calories from food to keep you going throughout the day. When you eat more calories than your body needs, your body stores the extra calories as fat. When you eat fewer calories than your body needs, your body burns fat to get the energy it needs. Calorie counting means keeping track of how many calories you eat and drink each day. Calorie counting can be helpful if you need to lose weight. If you make sure to eat fewer calories than your body needs, you should lose weight. Ask your health care provider what a healthy weight is for you. For calorie counting to work, you will need to eat the right number of calories in a day in order to lose a healthy amount of weight per week. A dietitian can help you determine how many calories you need in a day and will give you suggestions on how to reach your calorie goal.  A healthy amount of weight to lose per week is usually 1-2 lb (0.5-0.9 kg). This usually means that your daily calorie intake should be reduced by 500-750 calories.  Eating 1,200 - 1,500 calories per day can help most women lose weight.  Eating 1,500 - 1,800 calories per day can help most men lose weight.  What is my plan? My goal is to have __________ calories per day. If I have this many calories per day, I should lose around __________ pounds per week. What do I need to know about calorie counting? In order to meet your daily calorie goal, you will need to:  Find out how many calories are in each food you would like to eat. Try to do this before you eat.  Decide how much of the food you plan to eat.  Write down what you ate and how many calories it had. Doing this is called keeping a food log.  To successfully lose weight, it is important to balance calorie counting with a healthy lifestyle that includes regular activity. Aim for 150 minutes of moderate exercise (such as walking) or 75  minutes of vigorous exercise (such as running) each week. Where do I find calorie information?  The number of calories in a food can be found on a Nutrition Facts label. If a food does not have a Nutrition Facts label, try to look up the calories online or ask your dietitian for help. Remember that calories are listed per serving. If you choose to have more than one serving of a food, you will have to multiply the calories per serving by the amount of servings you plan to eat. For example, the label on a package of bread might say that a serving size is 1 slice and that there are 90 calories in a serving. If you eat 1 slice, you will have eaten 90 calories. If you eat 2 slices, you will have eaten 180 calories. How do I keep a food log? Immediately after each meal, record the following information in your food log:  What you ate. Don't forget to include toppings, sauces, and other extras on the food.  How much you ate. This can be measured in cups, ounces, or number of items.  How many calories each food and drink had.  The total number of calories in the meal.  Keep your food log near you, such as in a small notebook in your pocket, or use a mobile app or website. Some   programs will calculate calories for you and show you how many calories you have left for the day to meet your goal. What are some calorie counting tips?  Use your calories on foods and drinks that will fill you up and not leave you hungry: ? Some examples of foods that fill you up are nuts and nut butters, vegetables, lean proteins, and high-fiber foods like whole grains. High-fiber foods are foods with more than 5 g fiber per serving. ? Drinks such as sodas, specialty coffee drinks, alcohol, and juices have a lot of calories, yet do not fill you up.  Eat nutritious foods and avoid empty calories. Empty calories are calories you get from foods or beverages that do not have many vitamins or protein, such as candy, sweets, and  soda. It is better to have a nutritious high-calorie food (such as an avocado) than a food with few nutrients (such as a bag of chips).  Know how many calories are in the foods you eat most often. This will help you calculate calorie counts faster.  Pay attention to calories in drinks. Low-calorie drinks include water and unsweetened drinks.  Pay attention to nutrition labels for "low fat" or "fat free" foods. These foods sometimes have the same amount of calories or more calories than the full fat versions. They also often have added sugar, starch, or salt, to make up for flavor that was removed with the fat.  Find a way of tracking calories that works for you. Get creative. Try different apps or programs if writing down calories does not work for you. What are some portion control tips?  Know how many calories are in a serving. This will help you know how many servings of a certain food you can have.  Use a measuring cup to measure serving sizes. You could also try weighing out portions on a kitchen scale. With time, you will be able to estimate serving sizes for some foods.  Take some time to put servings of different foods on your favorite plates, bowls, and cups so you know what a serving looks like.  Try not to eat straight from a bag or box. Doing this can lead to overeating. Put the amount you would like to eat in a cup or on a plate to make sure you are eating the right portion.  Use smaller plates, glasses, and bowls to prevent overeating.  Try not to multitask (for example, watch TV or use your computer) while eating. If it is time to eat, sit down at a table and enjoy your food. This will help you to know when you are full. It will also help you to be aware of what you are eating and how much you are eating. What are tips for following this plan? Reading food labels  Check the calorie count compared to the serving size. The serving size may be smaller than what you are used to  eating.  Check the source of the calories. Make sure the food you are eating is high in vitamins and protein and low in saturated and trans fats. Shopping  Read nutrition labels while you shop. This will help you make healthy decisions before you decide to purchase your food.  Make a grocery list and stick to it. Cooking  Try to cook your favorite foods in a healthier way. For example, try baking instead of frying.  Use low-fat dairy products. Meal planning  Use more fruits and vegetables. Half of your plate should   be fruits and vegetables.  Include lean proteins like poultry and fish. How do I count calories when eating out?  Ask for smaller portion sizes.  Consider sharing an entree and sides instead of getting your own entree.  If you get your own entree, eat only half. Ask for a box at the beginning of your meal and put the rest of your entree in it so you are not tempted to eat it.  If calories are listed on the menu, choose the lower calorie options.  Choose dishes that include vegetables, fruits, whole grains, low-fat dairy products, and lean protein.  Choose items that are boiled, broiled, grilled, or steamed. Stay away from items that are buttered, battered, fried, or served with cream sauce. Items labeled "crispy" are usually fried, unless stated otherwise.  Choose water, low-fat milk, unsweetened iced tea, or other drinks without added sugar. If you want an alcoholic beverage, choose a lower calorie option such as a glass of wine or light beer.  Ask for dressings, sauces, and syrups on the side. These are usually high in calories, so you should limit the amount you eat.  If you want a salad, choose a garden salad and ask for grilled meats. Avoid extra toppings like bacon, cheese, or fried items. Ask for the dressing on the side, or ask for olive oil and vinegar or lemon to use as dressing.  Estimate how many servings of a food you are given. For example, a serving of  cooked rice is  cup or about the size of half a baseball. Knowing serving sizes will help you be aware of how much food you are eating at restaurants. The list below tells you how big or small some common portion sizes are based on everyday objects: ? 1 oz-4 stacked dice. ? 3 oz-1 deck of cards. ? 1 tsp-1 die. ? 1 Tbsp- a ping-pong ball. ? 2 Tbsp-1 ping-pong ball. ?  cup- baseball. ? 1 cup-1 baseball. Summary  Calorie counting means keeping track of how many calories you eat and drink each day. If you eat fewer calories than your body needs, you should lose weight.  A healthy amount of weight to lose per week is usually 1-2 lb (0.5-0.9 kg). This usually means reducing your daily calorie intake by 500-750 calories.  The number of calories in a food can be found on a Nutrition Facts label. If a food does not have a Nutrition Facts label, try to look up the calories online or ask your dietitian for help.  Use your calories on foods and drinks that will fill you up, and not on foods and drinks that will leave you hungry.  Use smaller plates, glasses, and bowls to prevent overeating. This information is not intended to replace advice given to you by your health care provider. Make sure you discuss any questions you have with your health care provider. Document Released: 07/18/2005 Document Revised: 06/17/2016 Document Reviewed: 06/17/2016 Elsevier Interactive Patient Education  2017 [employer]. Steps to Quit Smoking Smoking tobacco can be bad for your health. It can also affect almost every organ in your body. Smoking puts you and people around you at risk for many serious long-lasting (chronic) diseases. Quitting smoking is hard, but it is one of the best things that you can do for your health. It is never too late to quit. What are the benefits of quitting smoking? When you quit smoking, you lower your risk for getting serious diseases and conditions. They can  include:  Lung cancer or  lung disease.  Heart disease.  Stroke.  Heart attack.  Not being able to have children (infertility).  Weak bones (osteoporosis) and broken bones (fractures).  If you have coughing, wheezing, and shortness of breath, those symptoms may get better when you quit. You may also get sick less often. If you are pregnant, quitting smoking can help to lower your chances of having a baby of low birth weight. What can I do to help me quit smoking? Talk with your doctor about what can help you quit smoking. Some things you can do (strategies) include:  Quitting smoking totally, instead of slowly cutting back how much you smoke over a period of time.  Going to in-person counseling. You are more likely to quit if you go to many counseling sessions.  Using resources and support systems, such as: ? Database administrator with a Social worker. ? Phone quitlines. ? Careers information officer. ? Support groups or group counseling. ? Text messaging programs. ? Mobile phone apps or applications.  Taking medicines. Some of these medicines may have nicotine in them. If you are pregnant or breastfeeding, do not take any medicines to quit smoking unless your doctor says it is okay. Talk with your doctor about counseling or other things that can help you.  Talk with your doctor about using more than one strategy at the same time, such as taking medicines while you are also going to in-person counseling. This can help make quitting easier. What things can I do to make it easier to quit? Quitting smoking might feel very hard at first, but there is a lot that you can do to make it easier. Take these steps:  Talk to your family and friends. Ask them to support and encourage you.  Call phone quitlines, reach out to support groups, or work with a Social worker.  Ask people who smoke to not smoke around you.  Avoid places that make you want (trigger) to smoke, such as: ? Bars. ? Parties. ? Smoke-break areas at  work.  Spend time with people who do not smoke.  Lower the stress in your life. Stress can make you want to smoke. Try these things to help your stress: ? Getting regular exercise. ? Deep-breathing exercises. ? Yoga. ? Meditating. ? Doing a body scan. To do this, close your eyes, focus on one area of your body at a time from head to toe, and notice which parts of your body are tense. Try to relax the muscles in those areas.  Download or buy apps on your mobile phone or tablet that can help you stick to your quit plan. There are many free apps, such as QuitGuide from the [employer] Office manager for Disease Control and Prevention). You can find more support from smokefree.gov and other websites.  This information is not intended to replace advice given to you by your health care provider. Make sure you discuss any questions you have with your health care provider. Document Released: 05/14/2009 Document Revised: 03/15/2016 Document Reviewed: 12/02/2014 Elsevier Interactive Patient Education  2018 [employer].

## 2017-03-23 ENCOUNTER — Encounter: Payer: Self-pay | Admitting: Family Medicine

## 2017-03-23 LAB — LIPID PANEL
CHOLESTEROL TOTAL: 197 mg/dL (ref 100–199)
Chol/HDL Ratio: 3.6 ratio (ref 0.0–4.4)
HDL: 55 mg/dL (ref >39)
LDL CALC: 114 mg/dL — AB (ref 0–99)
Triglycerides: 141 mg/dL (ref 0–149)
VLDL Cholesterol Cal: 28 mg/dL (ref 5–40)

## 2017-03-23 LAB — RENAL FUNCTION PANEL
Albumin: 4.5 g/dL (ref 3.6–4.8)
BUN / CREAT RATIO: 18 (ref 12–28)
BUN: 17 mg/dL (ref 8–27)
CALCIUM: 9.7 mg/dL (ref 8.7–10.3)
CHLORIDE: 105 mmol/L (ref 96–106)
CO2: 20 mmol/L (ref 20–29)
CREATININE: 0.94 mg/dL (ref 0.57–1.00)
GFR calc Af Amer: 76 mL/min/{1.73_m2} (ref >59)
GFR calc non Af Amer: 66 mL/min/{1.73_m2} (ref >59)
GLUCOSE: 83 mg/dL (ref 65–99)
Phosphorus: 2.9 mg/dL (ref 2.5–4.5)
Potassium: 4.4 mmol/L (ref 3.5–5.2)
SODIUM: 144 mmol/L (ref 134–144)

## 2017-03-23 LAB — HIV ANTIBODY (ROUTINE TESTING W REFLEX): HIV SCREEN 4TH GENERATION: NONREACTIVE

## 2017-03-23 LAB — HEPATITIS C ANTIBODY

## 2017-03-29 ENCOUNTER — Ambulatory Visit
Admission: RE | Admit: 2017-03-29 | Discharge: 2017-03-29 | Disposition: A | Discharge status: Home / Self Care | Payer: BLUE CROSS/BLUE SHIELD | Source: Ambulatory Visit | Attending: Family Medicine | Admitting: Family Medicine

## 2017-03-29 DIAGNOSIS — R928 Other abnormal and inconclusive findings on diagnostic imaging of breast: Secondary | ICD-10-CM | POA: Diagnosis not present

## 2017-03-29 DIAGNOSIS — Z1231 Encounter for screening mammogram for malignant neoplasm of breast: Secondary | ICD-10-CM | POA: Diagnosis present

## 2017-03-29 DIAGNOSIS — Z1239 Encounter for other screening for malignant neoplasm of breast: Secondary | ICD-10-CM

## 2017-04-06 ENCOUNTER — Other Ambulatory Visit: Payer: Self-pay | Admitting: Family Medicine

## 2017-04-06 DIAGNOSIS — N6489 Other specified disorders of breast: Secondary | ICD-10-CM

## 2017-04-06 DIAGNOSIS — R928 Other abnormal and inconclusive findings on diagnostic imaging of breast: Secondary | ICD-10-CM

## 2017-04-20 ENCOUNTER — Ambulatory Visit
Admission: RE | Admit: 2017-04-20 | Discharge: 2017-04-20 | Disposition: A | Discharge status: Home / Self Care | Payer: BLUE CROSS/BLUE SHIELD | Source: Ambulatory Visit | Attending: Family Medicine | Admitting: Family Medicine

## 2017-04-20 DIAGNOSIS — N6002 Solitary cyst of left breast: Secondary | ICD-10-CM | POA: Diagnosis not present

## 2017-04-20 DIAGNOSIS — N6489 Other specified disorders of breast: Secondary | ICD-10-CM | POA: Insufficient documentation

## 2017-04-20 DIAGNOSIS — R928 Other abnormal and inconclusive findings on diagnostic imaging of breast: Secondary | ICD-10-CM | POA: Diagnosis present

## 2017-05-27 ENCOUNTER — Encounter: Payer: Self-pay | Admitting: Gynecology

## 2017-05-27 ENCOUNTER — Ambulatory Visit
Admission: EM | Admit: 2017-05-27 | Discharge: 2017-05-27 | Disposition: A | Discharge status: Home / Self Care | Payer: BLUE CROSS/BLUE SHIELD | Attending: Family Medicine | Admitting: Family Medicine

## 2017-05-27 DIAGNOSIS — R1032 Left lower quadrant pain: Secondary | ICD-10-CM | POA: Diagnosis not present

## 2017-05-27 DIAGNOSIS — R110 Nausea: Secondary | ICD-10-CM | POA: Diagnosis not present

## 2017-05-27 LAB — URINALYSIS, COMPLETE (UACMP) WITH MICROSCOPIC
BILIRUBIN URINE: NEGATIVE
Glucose, UA: NEGATIVE mg/dL
Hgb urine dipstick: NEGATIVE
KETONES UR: NEGATIVE mg/dL
NITRITE: NEGATIVE
PH: 7 (ref 5.0–8.0)
PROTEIN: NEGATIVE mg/dL
RBC / HPF: NONE SEEN RBC/hpf (ref 0–5)
Specific Gravity, Urine: 1.01 (ref 1.005–1.030)

## 2017-05-27 MED ORDER — CIPROFLOXACIN HCL 500 MG PO TABS: 500.0000 mg | ORAL_TABLET | Freq: Two times a day (BID) | ORAL | 0 refills | Status: DC

## 2017-05-27 NOTE — ED Provider Notes (Signed)
[facility]    CSN: [id number] Arrival date & time: 05/27/17  0855     History   Chief Complaint Chief Complaint  Patient presents with  . Abdominal Pain    HPI Julie Maldonado is a 61 y.o. female.   The history is provided by the patient.  Abdominal Pain  Pain location:  LLQ Pain quality: cramping   Pain radiates to:  Does not radiate Pain severity:  Mild Onset quality:  Sudden Duration:  2 days Timing:  Intermittent Progression:  Waxing and waning Chronicity:  New Context: recent illness   Context: not alcohol use, not awakening from sleep, not diet changes, not eating, not laxative use, not medication withdrawal, not previous surgeries, not recent sexual activity, not recent travel, not retching, not sick contacts, not suspicious food intake and not trauma   Relieved by:  None tried Associated symptoms: diarrhea and nausea   Associated symptoms: no anorexia, no belching, no chest pain, no chills, no constipation, no cough, no dysuria, no fatigue, no fever, no flatus, no hematemesis, no hematochezia, no hematuria, no melena, no shortness of breath, no sore throat, no vaginal bleeding, no vaginal discharge and no vomiting   Risk factors: no alcohol abuse, not elderly, has not had multiple surgeries, no NSAID use, not pregnant and no recent hospitalization     Past Medical History:  Diagnosis Date  . Hyperlipemia   . Hypertension   . Insomnia     Patient Active Problem List   Diagnosis Date Noted  . Essential hypertension 04/20/2016  . Hyperlipidemia 04/20/2016  . Acute maxillary sinusitis 04/20/2016  . Cigarette nicotine dependence with nicotine-induced disorder 04/20/2016    Past Surgical History:  Procedure Laterality Date  . ABDOMINAL HYSTERECTOMY    . BACK SURGERY      OB History    No data available       Home Medications    Prior to Admission medications   Medication Sig Start Date End Date Taking? Authorizing Provider  aspirin  81 MG tablet Take 81 mg by mouth daily.   Yes [provider]  atorvastatin (LIPITOR) 10 MG tablet Take 1 tablet (10 mg total) by mouth daily. 03/22/17  Yes Duanne Limerick, MD  lisinopril-hydrochlorothiazide (PRINZIDE,ZESTORETIC) 10-12.5 MG tablet Take 1 tablet by mouth daily. 03/22/17  Yes Duanne Limerick, MD  Omega 3 1000 MG CAPS Take 1 capsule (1,000 mg total) by mouth 2 (two) times daily. 03/22/17  Yes Duanne Limerick, MD  ciprofloxacin (CIPRO) 500 MG tablet Take 1 tablet (500 mg total) by mouth every 12 (twelve) hours. 05/27/17   Payton Mccallum, MD    Family History Family History  Problem Relation Age of Onset  . Cancer Mother   . Diabetes Maternal Grandfather   . Stroke Maternal Grandfather     Social History Social History  Substance Use Topics  . Smoking status: Current Every Day Smoker  . Smokeless tobacco: Never Used  . Alcohol use No     Allergies   Sulfa antibiotics   Review of Systems Review of Systems  Constitutional: Negative for chills, fatigue and fever.  HENT: Negative for sore throat.   Respiratory: Negative for cough and shortness of breath.   Cardiovascular: Negative for chest pain.  Gastrointestinal: Positive for abdominal pain, diarrhea and nausea. Negative for anorexia, constipation, flatus, hematemesis, hematochezia, melena and vomiting.  Genitourinary: Negative for dysuria, hematuria, vaginal bleeding and vaginal discharge.     Physical Exam Triage Vital Signs  ED Triage Vitals  Enc Vitals Group     BP 05/27/17 0920 117/85     Pulse Rate 05/27/17 0920 88     Resp 05/27/17 0920 16     Temp 05/27/17 0920 98.3 F (36.8 C)     Temp Source 05/27/17 0920 Oral     SpO2 05/27/17 0920 100 %     Weight 05/27/17 0923 178 lb (80.7 kg)     Height 05/27/17 0923 5\' 4"  (1.626 m)     Head Circumference --      Peak Flow --      Pain Score 05/27/17 0924 2     Pain Loc --      Pain Edu? --      Excl. in GC? --    No data found.   Updated  Vital Signs BP 117/85 (BP Location: Left Arm)   Pulse 88   Temp 98.3 F (36.8 C) (Oral)   Resp 16   Ht 5\' 4"  (1.626 m)   Wt 178 lb (80.7 kg)   SpO2 100%   BMI 30.55 kg/m   Visual Acuity Right Eye Distance:   Left Eye Distance:   Bilateral Distance:    Right Eye Near:   Left Eye Near:    Bilateral Near:     Physical Exam  Constitutional: She appears well-developed and well-nourished. No distress.  Abdominal: Soft. Bowel sounds are normal. She exhibits no distension and no mass. There is tenderness (mild; left lower quadrant). There is no rebound and no guarding.  Skin: She is not diaphoretic.  Nursing note and vitals reviewed.    UC Treatments / Results  Labs (all labs ordered are listed, but only abnormal results are displayed) Labs Reviewed  URINALYSIS, COMPLETE (UACMP) WITH MICROSCOPIC - Abnormal; Notable for the following:       Result Value   Color, Urine STRAW (*)    Leukocytes, UA TRACE (*)    Squamous Epithelial / LPF 6-30 (*)    Bacteria, UA FEW (*)    All other components within normal limits    EKG  EKG Interpretation None       Radiology No results found.  Procedures Procedures (including critical care time)  Medications Ordered in UC Medications - No data to display   Initial Impression / Assessment and Plan / UC Course  I have reviewed the triage vital signs and the nursing notes.  Pertinent labs & imaging results that were available during my care of the patient were reviewed by me and considered in my medical decision making (see chart for details).       Final Clinical Impressions(s) / UC Diagnoses   Final diagnoses:  Abdominal pain, left lower quadrant  (possible early diverticulitis vs UTI?)  New Prescriptions Discharge Medication List as of 05/27/2017  9:48 AM    START taking these medications   Details  ciprofloxacin (CIPRO) 500 MG tablet Take 1 tablet (500 mg total) by mouth every 12 (twelve) hours., Starting Sat  05/27/2017, Normal       1. Lab results and diagnosis reviewed with patient 2. rx as per orders above; reviewed possible side effects, interactions, risks and benefits; (empiric)  3. Recommend supportive treatment with clear liquids/increased fluids then advance diet slowly as tolerated 4. Follow-up prn if symptoms worsen or don't improve  Controlled Substance Prescriptions [place] Controlled Substance Registry consulted? Not Applicable   Payton Mccallumonty, Tynell Winchell, MD 05/27/17 1002

## 2017-05-27 NOTE — ED Triage Notes (Signed)
Per patient left side abdomen discomfort x couple days.

## 2017-05-27 NOTE — Discharge Instructions (Signed)
Lots of fluids, bland diet, tylenol as needed

## 2017-06-23 ENCOUNTER — Encounter: Payer: Self-pay | Admitting: Family Medicine

## 2017-06-26 NOTE — Telephone Encounter (Signed)
Patient mychart message.

## 2017-09-18 ENCOUNTER — Other Ambulatory Visit: Payer: Self-pay | Admitting: Family Medicine

## 2017-09-18 DIAGNOSIS — E7800 Pure hypercholesterolemia, unspecified: Secondary | ICD-10-CM

## 2017-09-18 DIAGNOSIS — I10 Essential (primary) hypertension: Secondary | ICD-10-CM

## 2017-10-14 ENCOUNTER — Other Ambulatory Visit: Payer: Self-pay | Admitting: Family Medicine

## 2017-10-14 DIAGNOSIS — E7800 Pure hypercholesterolemia, unspecified: Secondary | ICD-10-CM

## 2017-10-14 DIAGNOSIS — I10 Essential (primary) hypertension: Secondary | ICD-10-CM

## 2017-11-12 ENCOUNTER — Other Ambulatory Visit: Payer: Self-pay | Admitting: Family Medicine

## 2017-11-12 DIAGNOSIS — E7800 Pure hypercholesterolemia, unspecified: Secondary | ICD-10-CM

## 2017-11-12 DIAGNOSIS — I10 Essential (primary) hypertension: Secondary | ICD-10-CM

## 2017-11-17 ENCOUNTER — Ambulatory Visit: Payer: BLUE CROSS/BLUE SHIELD | Admitting: Family Medicine

## 2017-11-20 ENCOUNTER — Ambulatory Visit: Payer: BLUE CROSS/BLUE SHIELD | Admitting: Family Medicine

## 2017-11-20 ENCOUNTER — Encounter: Payer: Self-pay | Admitting: Family Medicine

## 2017-11-20 VITALS — BP 110/80 | HR 60 | Ht 64.0 in | Wt 172.0 lb

## 2017-11-20 DIAGNOSIS — Z23 Encounter for immunization: Secondary | ICD-10-CM

## 2017-11-20 DIAGNOSIS — Z716 Tobacco abuse counseling: Secondary | ICD-10-CM

## 2017-11-20 DIAGNOSIS — E7800 Pure hypercholesterolemia, unspecified: Secondary | ICD-10-CM | POA: Diagnosis not present

## 2017-11-20 DIAGNOSIS — I10 Essential (primary) hypertension: Secondary | ICD-10-CM | POA: Diagnosis not present

## 2017-11-20 DIAGNOSIS — F17210 Nicotine dependence, cigarettes, uncomplicated: Secondary | ICD-10-CM | POA: Diagnosis not present

## 2017-11-20 MED ORDER — LISINOPRIL-HYDROCHLOROTHIAZIDE 10-12.5 MG PO TABS: 1.0000 | ORAL_TABLET | Freq: Every day | ORAL | 6 refills | Status: DC

## 2017-11-20 MED ORDER — ATORVASTATIN CALCIUM 10 MG PO TABS: 10.0000 mg | ORAL_TABLET | Freq: Every day | ORAL | 6 refills | Status: DC

## 2017-11-20 NOTE — Progress Notes (Signed)
Name: Julie Maldonado   MRN: [id number]    DOB: 06-Aug-1962   Date:11/20/2017       Progress Note  Subjective  Chief Complaint  Chief Complaint  Patient presents with  . Hyperlipidemia  . Hypertension    Hyperlipidemia  This is a chronic problem. The current episode started more than 1 year ago. The problem is controlled. Recent lipid tests were reviewed and are normal. She has no history of chronic renal disease, diabetes, hypothyroidism, liver disease or obesity. There are no known factors aggravating her hyperlipidemia. Pertinent negatives include no chest pain, focal sensory loss, focal weakness, leg pain, myalgias or shortness of breath. Current antihyperlipidemic treatment includes statins (omega). The current treatment provides moderate improvement of lipids. There are no compliance problems.  There are no known risk factors for coronary artery disease.  Hypertension  This is a chronic problem. The current episode started more than 1 year ago. The problem is unchanged. The problem is controlled. Pertinent negatives include no anxiety, blurred vision, chest pain, headaches, malaise/fatigue, neck pain, orthopnea, palpitations, peripheral edema, PND, shortness of breath or sweats. There are no associated agents to hypertension. There are no known risk factors for coronary artery disease. Past treatments include ACE inhibitors and diuretics. The current treatment provides moderate improvement. There are no compliance problems.  There is no history of angina, kidney disease, CAD/MI, CVA, heart failure, left ventricular hypertrophy, PVD or retinopathy. There is no history of chronic renal disease, a hypertension causing med or renovascular disease.    No problem-specific Assessment & Plan notes found for this encounter.   Past Medical History:  Diagnosis Date  . Hyperlipemia   . Hypertension   . Insomnia     Past Surgical History:  Procedure Laterality Date  . ABDOMINAL HYSTERECTOMY    .  BACK SURGERY      Family History  Problem Relation Age of Onset  . Cancer Mother   . Diabetes Maternal Grandfather   . Stroke Maternal Grandfather     Social History   Socioeconomic History  . Marital status: Legally Separated    Spouse name: Not on file  . Number of children: Not on file  . Years of education: Not on file  . Highest education level: Not on file  Occupational History  . Not on file  Social Needs  . Financial resource strain: Not on file  . Food insecurity:    Worry: Not on file    Inability: Not on file  . Transportation needs:    Medical: Not on file    Non-medical: Not on file  Tobacco Use  . Smoking status: Current Some Day Smoker    Types: Cigarettes  . Smokeless tobacco: Never Used  . Tobacco comment: patient will pick up patches  Substance and Sexual Activity  . Alcohol use: No  . Drug use: No  . Sexual activity: Never  Lifestyle  . Physical activity:    Days per week: Not on file    Minutes per session: Not on file  . Stress: Not on file  Relationships  . Social connections:    Talks on phone: Not on file    Gets together: Not on file    Attends religious service: Not on file    Active member of club or organization: Not on file    Attends meetings of clubs or organizations: Not on file    Relationship status: Not on file  . Intimate partner violence:    Fear  of current or ex partner: Not on file    Emotionally abused: Not on file    Physically abused: Not on file    Forced sexual activity: Not on file  Other Topics Concern  . Not on file  Social History Narrative  . Not on file    Allergies  Allergen Reactions  . Sulfa Antibiotics Hives    Outpatient Medications Prior to Visit  Medication Sig Dispense Refill  . aspirin 81 MG tablet Take 81 mg by mouth daily.    . Omega 3 1000 MG CAPS Take 1 capsule (1,000 mg total) by mouth 2 (two) times daily. 90 each 3  . atorvastatin (LIPITOR) 10 MG tablet TAKE 1 TABLET BY MOUTH EVERY  DAY 30 tablet 0  . lisinopril-hydrochlorothiazide (PRINZIDE,ZESTORETIC) 10-12.5 MG tablet TAKE 1 TABLET BY MOUTH DAILY 30 tablet 0  . ciprofloxacin (CIPRO) 500 MG tablet Take 1 tablet (500 mg total) by mouth every 12 (twelve) hours. 14 tablet 0   No facility-administered medications prior to visit.     Review of Systems  Constitutional: Negative for chills, fever, malaise/fatigue and weight loss.  HENT: Negative for ear discharge, ear pain and sore throat.   Eyes: Negative for blurred vision.  Respiratory: Negative for cough, sputum production, shortness of breath and wheezing.   Cardiovascular: Negative for chest pain, palpitations, orthopnea, leg swelling and PND.  Gastrointestinal: Negative for abdominal pain, blood in stool, constipation, diarrhea, heartburn, melena and nausea.  Genitourinary: Negative for dysuria, frequency, hematuria and urgency.  Musculoskeletal: Negative for back pain, joint pain, myalgias and neck pain.  Skin: Negative for rash.  Neurological: Negative for dizziness, tingling, sensory change, focal weakness and headaches.  Endo/Heme/Allergies: Negative for environmental allergies and polydipsia. Does not bruise/bleed easily.  Psychiatric/Behavioral: Negative for depression and suicidal ideas. The patient is not nervous/anxious and does not have insomnia.      Objective  Vitals:   11/20/17 1054  BP: 110/80  Pulse: 60  Weight: 172 lb (78 kg)  Height: 5\' 4"  (1.626 m)    Physical Exam  Constitutional: No distress.  HENT:  Head: Normocephalic and atraumatic.  Right Ear: Tympanic membrane, external ear and ear canal normal.  Left Ear: Tympanic membrane, external ear and ear canal normal.  Nose: Nose normal.  Mouth/Throat: Oropharynx is clear and moist.  Eyes: Pupils are equal, round, and reactive to light. Conjunctivae and EOM are normal. Right eye exhibits no discharge. Left eye exhibits no discharge.  Neck: Normal range of motion. Neck supple. Normal  carotid pulses, no hepatojugular reflux and no JVD present. Carotid bruit is not present. No thyromegaly present.  Cardiovascular: Normal rate, regular rhythm, S1 normal, S2 normal, normal heart sounds and intact distal pulses. Exam reveals no gallop, no S3, no S4 and no friction rub.  No murmur heard. Pulmonary/Chest: Effort normal and breath sounds normal. No stridor. She has no wheezes.  Abdominal: Soft. Bowel sounds are normal. She exhibits no mass. There is no hepatosplenomegaly. There is no tenderness. There is no guarding.  Musculoskeletal: Normal range of motion. She exhibits no edema.  Lymphadenopathy:    She has no cervical adenopathy.  Neurological: She is alert. She has normal reflexes.  Skin: Skin is warm and dry. She is not diaphoretic.  Nursing note and vitals reviewed.     Assessment & Plan  Problem List Items Addressed This Visit      Cardiovascular and Mediastinum   Essential hypertension - Primary   Relevant Medications   atorvastatin (  LIPITOR) 10 MG tablet   lisinopril-hydrochlorothiazide (PRINZIDE,ZESTORETIC) 10-12.5 MG tablet   Other Relevant Orders   Renal Function Panel     Other   Hyperlipidemia   Relevant Medications   atorvastatin (LIPITOR) 10 MG tablet   lisinopril-hydrochlorothiazide (PRINZIDE,ZESTORETIC) 10-12.5 MG tablet    Other Visit Diagnoses    Encounter for tobacco use cessation counseling       Cigarette nicotine dependence without complication       Relevant Orders   Pneumococcal polysaccharide vaccine 23-valent greater than or equal to 2yo subcutaneous/IM (Completed)      Meds ordered this encounter  Medications  . atorvastatin (LIPITOR) 10 MG tablet    Sig: Take 1 tablet (10 mg total) by mouth daily.    Dispense:  30 tablet    Refill:  6    Needs appt  . lisinopril-hydrochlorothiazide (PRINZIDE,ZESTORETIC) 10-12.5 MG tablet    Sig: Take 1 tablet by mouth daily.    Dispense:  30 tablet    Refill:  6      Dr. Elizabeth Sauer [facility]  11/20/17

## 2017-11-21 LAB — RENAL FUNCTION PANEL
ALBUMIN: 4.9 g/dL — AB (ref 3.6–4.8)
BUN / CREAT RATIO: 14 (ref 12–28)
BUN: 16 mg/dL (ref 8–27)
CALCIUM: 9.7 mg/dL (ref 8.7–10.3)
CO2: 23 mmol/L (ref 20–29)
CREATININE: 1.14 mg/dL — AB (ref 0.57–1.00)
Chloride: 99 mmol/L (ref 96–106)
GFR calc Af Amer: 60 mL/min/{1.73_m2} (ref >59)
GFR calc non Af Amer: 52 mL/min/{1.73_m2} — ABNORMAL LOW (ref >59)
Glucose: 80 mg/dL (ref 65–99)
Phosphorus: 3.4 mg/dL (ref 2.5–4.5)
Potassium: 4.4 mmol/L (ref 3.5–5.2)
Sodium: 141 mmol/L (ref 134–144)

## 2017-12-11 ENCOUNTER — Other Ambulatory Visit: Payer: Self-pay | Admitting: Family Medicine

## 2017-12-11 DIAGNOSIS — E7800 Pure hypercholesterolemia, unspecified: Secondary | ICD-10-CM

## 2017-12-11 DIAGNOSIS — I10 Essential (primary) hypertension: Secondary | ICD-10-CM

## 2018-03-05 ENCOUNTER — Other Ambulatory Visit: Payer: Self-pay | Admitting: Family Medicine

## 2018-03-05 DIAGNOSIS — E7800 Pure hypercholesterolemia, unspecified: Secondary | ICD-10-CM

## 2018-03-05 DIAGNOSIS — I10 Essential (primary) hypertension: Secondary | ICD-10-CM

## 2018-04-07 ENCOUNTER — Other Ambulatory Visit: Payer: Self-pay | Admitting: Family Medicine

## 2018-04-07 DIAGNOSIS — E7800 Pure hypercholesterolemia, unspecified: Secondary | ICD-10-CM

## 2018-04-07 DIAGNOSIS — I10 Essential (primary) hypertension: Secondary | ICD-10-CM

## 2018-05-09 ENCOUNTER — Other Ambulatory Visit: Payer: Self-pay | Admitting: Family Medicine

## 2018-05-09 DIAGNOSIS — E7800 Pure hypercholesterolemia, unspecified: Secondary | ICD-10-CM

## 2018-05-09 DIAGNOSIS — I10 Essential (primary) hypertension: Secondary | ICD-10-CM

## 2018-05-22 ENCOUNTER — Encounter: Payer: Self-pay | Admitting: Family Medicine

## 2018-05-22 ENCOUNTER — Ambulatory Visit: Payer: BLUE CROSS/BLUE SHIELD | Admitting: Family Medicine

## 2018-05-22 VITALS — BP 130/88 | HR 76 | Ht 64.0 in | Wt 169.0 lb

## 2018-05-22 DIAGNOSIS — Z23 Encounter for immunization: Secondary | ICD-10-CM

## 2018-05-22 DIAGNOSIS — I10 Essential (primary) hypertension: Secondary | ICD-10-CM | POA: Diagnosis not present

## 2018-05-22 DIAGNOSIS — Z716 Tobacco abuse counseling: Secondary | ICD-10-CM | POA: Diagnosis not present

## 2018-05-22 DIAGNOSIS — E7800 Pure hypercholesterolemia, unspecified: Secondary | ICD-10-CM | POA: Diagnosis not present

## 2018-05-22 MED ORDER — OMEGA 3 1000 MG PO CAPS: 1.0000 | ORAL_CAPSULE | Freq: Two times a day (BID) | ORAL | 3 refills | Status: DC

## 2018-05-22 MED ORDER — LISINOPRIL-HYDROCHLOROTHIAZIDE 10-12.5 MG PO TABS: 1.0000 | ORAL_TABLET | Freq: Every day | ORAL | 1 refills | Status: DC

## 2018-05-22 MED ORDER — ATORVASTATIN CALCIUM 10 MG PO TABS: 10.0000 mg | ORAL_TABLET | Freq: Every day | ORAL | 1 refills | Status: DC

## 2018-05-22 NOTE — Progress Notes (Signed)
Date:  05/22/2018   Name:  Julie Maldonado   DOB:  1955-09-12   MRN:  [id number]   Chief Complaint: Hypertension; Hyperlipidemia; and Flu Vaccine Hypertension  This is a chronic problem. The current episode started more than 1 year ago. The problem is unchanged. The problem is controlled. Pertinent negatives include no anxiety, blurred vision, chest pain, headaches, malaise/fatigue, neck pain, orthopnea, palpitations, peripheral edema, PND, shortness of breath or sweats. There are no associated agents to hypertension. There are no known risk factors for coronary artery disease. Past treatments include ACE inhibitors and diuretics. The current treatment provides moderate improvement. There are no compliance problems.  There is no history of angina, kidney disease, CAD/MI, CVA, heart failure, left ventricular hypertrophy, PVD or retinopathy. There is no history of chronic renal disease, a hypertension causing med or renovascular disease.  Hyperlipidemia  This is a chronic problem. The current episode started more than 1 year ago. The problem is controlled. She has no history of chronic renal disease, diabetes, hypothyroidism, liver disease or obesity. Factors aggravating her hyperlipidemia include thiazides. Pertinent negatives include no chest pain, myalgias or shortness of breath. Current antihyperlipidemic treatment includes statins. There are no compliance problems.      Review of Systems  Constitutional: Negative.  Negative for chills, fatigue, fever, malaise/fatigue and unexpected weight change.  HENT: Negative for congestion, ear discharge, ear pain, rhinorrhea, sinus pressure, sneezing and sore throat.   Eyes: Negative for blurred vision, photophobia, pain, discharge, redness and itching.  Respiratory: Negative for cough, shortness of breath, wheezing and stridor.   Cardiovascular: Negative for chest pain, palpitations, orthopnea and PND.  Gastrointestinal: Negative for abdominal pain,  blood in stool, constipation, diarrhea, nausea and vomiting.  Endocrine: Negative for cold intolerance, heat intolerance, polydipsia, polyphagia and polyuria.  Genitourinary: Negative for dysuria, flank pain, frequency, hematuria, menstrual problem, pelvic pain, urgency, vaginal bleeding and vaginal discharge.  Musculoskeletal: Negative for arthralgias, back pain, myalgias and neck pain.  Skin: Negative for rash.  Allergic/Immunologic: Negative for environmental allergies and food allergies.  Neurological: Negative for dizziness, weakness, light-headedness, numbness and headaches.  Hematological: Negative for adenopathy. Does not bruise/bleed easily.  Psychiatric/Behavioral: Negative for dysphoric mood. The patient is not nervous/anxious.     Patient Active Problem List   Diagnosis Date Noted  . Essential hypertension 04/20/2016  . Hyperlipidemia 04/20/2016  . Acute maxillary sinusitis 04/20/2016  . Cigarette nicotine dependence with nicotine-induced disorder 04/20/2016    Allergies  Allergen Reactions  . Sulfa Antibiotics Hives    Past Surgical History:  Procedure Laterality Date  . ABDOMINAL HYSTERECTOMY    . BACK SURGERY      Social History   Tobacco Use  . Smoking status: Current Some Day Smoker    Types: Cigarettes  . Smokeless tobacco: Never Used  . Tobacco comment: patient will pick up patches  Substance Use Topics  . Alcohol use: No  . Drug use: No     Medication list has been reviewed and updated.  Current Meds  Medication Sig  . aspirin 81 MG tablet Take 81 mg by mouth daily.  Marland Kitchen atorvastatin (LIPITOR) 10 MG tablet Take 1 tablet (10 mg total) by mouth daily.  Marland Kitchen lisinopril-hydrochlorothiazide (PRINZIDE,ZESTORETIC) 10-12.5 MG tablet Take 1 tablet by mouth daily.  . Omega 3 1000 MG CAPS Take 1 capsule (1,000 mg total) by mouth 2 (two) times daily.  . [DISCONTINUED] atorvastatin (LIPITOR) 10 MG tablet Take 1 tablet (10 mg total) by mouth daily.  .  [  DISCONTINUED] lisinopril-hydrochlorothiazide (PRINZIDE,ZESTORETIC) 10-12.5 MG tablet Take 1 tablet by mouth daily.  . [DISCONTINUED] Omega 3 1000 MG CAPS Take 1 capsule (1,000 mg total) by mouth 2 (two) times daily.    PHQ 2/9 Scores 05/22/2018 03/22/2017 01/13/2016  PHQ - 2 Score 0 0 0  PHQ- 9 Score - 1 -    Physical Exam  BP 130/88   Pulse 76   Ht 5\' 4"  (1.626 m)   Wt 169 lb (76.7 kg)   BMI 29.01 kg/m   Assessment and Plan: 1. Essential hypertension Chronic Stable controlled. Continue  Lisinopril-HCTZ 10-12.5Check renal panel. - lisinopril-hydrochlorothiazide (PRINZIDE,ZESTORETIC) 10-12.5 MG tablet; Take 1 tablet by mouth daily.  Dispense: 90 tablet; Refill: 1 - Renal Function Panel  2. Pure hypercholesterolemia Chronic Controlled Continue atorvastatin 10 mg daily Check lipid panel - atorvastatin (LIPITOR) 10 MG tablet; Take 1 tablet (10 mg total) by mouth daily.  Dispense: 90 tablet; Refill: 1 - Omega 3 1000 MG CAPS; Take 1 capsule (1,000 mg total) by mouth 2 (two) times daily.  Dispense: 90 each; Refill: 3 - Lipid panel  3. Encounter for tobacco use cessation counseling Patient has been advised of the health risks of smoking and counseled concerning cessation of tobacco products. I spent over 3 minutes for discussion and to answer questions.  4. Flu vaccine need Discussed and administered. - Flu Vaccine QUAD 6+ mos PF IM (Fluarix Quad PF)     Dr. Elizabeth Sauer [facility]  05/22/2018

## 2018-05-23 LAB — RENAL FUNCTION PANEL
Albumin: 4.8 g/dL (ref 3.6–4.8)
BUN / CREAT RATIO: 13 (ref 12–28)
BUN: 14 mg/dL (ref 8–27)
CALCIUM: 9.9 mg/dL (ref 8.7–10.3)
CHLORIDE: 103 mmol/L (ref 96–106)
CO2: 23 mmol/L (ref 20–29)
Creatinine, Ser: 1.12 mg/dL — ABNORMAL HIGH (ref 0.57–1.00)
GFR calc Af Amer: 60 mL/min/{1.73_m2} (ref >59)
GFR calc non Af Amer: 52 mL/min/{1.73_m2} — ABNORMAL LOW (ref >59)
Glucose: 103 mg/dL — ABNORMAL HIGH (ref 65–99)
Phosphorus: 3 mg/dL (ref 2.5–4.5)
Potassium: 4.5 mmol/L (ref 3.5–5.2)
SODIUM: 142 mmol/L (ref 134–144)

## 2018-05-23 LAB — LIPID PANEL
CHOLESTEROL TOTAL: 184 mg/dL (ref 100–199)
Chol/HDL Ratio: 3.3 ratio (ref 0.0–4.4)
HDL: 55 mg/dL (ref >39)
LDL Calculated: 105 mg/dL — ABNORMAL HIGH (ref 0–99)
Triglycerides: 118 mg/dL (ref 0–149)
VLDL Cholesterol Cal: 24 mg/dL (ref 5–40)

## 2018-06-08 ENCOUNTER — Other Ambulatory Visit: Payer: Self-pay | Admitting: Family Medicine

## 2018-06-08 DIAGNOSIS — I10 Essential (primary) hypertension: Secondary | ICD-10-CM

## 2018-06-08 DIAGNOSIS — E7800 Pure hypercholesterolemia, unspecified: Secondary | ICD-10-CM

## 2018-08-06 ENCOUNTER — Ambulatory Visit
Admission: EM | Admit: 2018-08-06 | Discharge: 2018-08-06 | Disposition: A | Discharge status: Home / Self Care | Payer: BLUE CROSS/BLUE SHIELD | Attending: Family Medicine | Admitting: Family Medicine

## 2018-08-06 ENCOUNTER — Encounter: Payer: Self-pay | Admitting: Emergency Medicine

## 2018-08-06 ENCOUNTER — Other Ambulatory Visit: Payer: Self-pay

## 2018-08-06 DIAGNOSIS — R05 Cough: Secondary | ICD-10-CM | POA: Diagnosis not present

## 2018-08-06 DIAGNOSIS — R0981 Nasal congestion: Secondary | ICD-10-CM | POA: Diagnosis not present

## 2018-08-06 DIAGNOSIS — R509 Fever, unspecified: Secondary | ICD-10-CM | POA: Diagnosis not present

## 2018-08-06 DIAGNOSIS — F17210 Nicotine dependence, cigarettes, uncomplicated: Secondary | ICD-10-CM | POA: Diagnosis not present

## 2018-08-06 DIAGNOSIS — J111 Influenza due to unidentified influenza virus with other respiratory manifestations: Secondary | ICD-10-CM

## 2018-08-06 DIAGNOSIS — R69 Illness, unspecified: Secondary | ICD-10-CM | POA: Insufficient documentation

## 2018-08-06 MED ORDER — OSELTAMIVIR PHOSPHATE 75 MG PO CAPS: 75.0000 mg | ORAL_CAPSULE | Freq: Two times a day (BID) | ORAL | 0 refills | Status: DC

## 2018-08-06 MED ORDER — FLUTICASONE PROPIONATE 50 MCG/ACT NA SUSP: 1.0000 | Freq: Every day | NASAL | 0 refills | Status: DC

## 2018-08-06 NOTE — ED Provider Notes (Signed)
[facility] ____________________________________________  Time seen: Approximately 8:47 AM  I have reviewed the triage vital signs and the nursing notes.   HISTORY  Chief Complaint Cough and Nasal Congestion   HPI Julie Maldonado is a 63 y.o. female present for evaluation of 2 days of runny nose, nasal congestion, sinus congestion and some left ear discomfort.  Accompanying cough.  Reports accompanying fevers as well.  States T-max 104, states that was at initial onset, otherwise temperature has been maintaining just over 100 per patient.  Occasional tickle throat.  Denies sore throat.  Overall continues to eat and drink well.  Has been taken over-the-counter cough and congestion medication with slight improvement, no resolution.  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  Denies accompanying chest pain or shortness of breath.  Reports otherwise doing well.  Denies other recent sickness.  Duanne Limerick, MD: PCP   Past Medical History:  Diagnosis Date  . Hyperlipemia   . Hypertension   . Insomnia     Patient Active Problem List   Diagnosis Date Noted  . Essential hypertension 04/20/2016  . Hyperlipidemia 04/20/2016  . Acute maxillary sinusitis 04/20/2016  . Cigarette nicotine dependence with nicotine-induced disorder 04/20/2016    Past Surgical History:  Procedure Laterality Date  . ABDOMINAL HYSTERECTOMY    . BACK SURGERY       No current facility-administered medications for this encounter.   Current Outpatient Medications:  .  aspirin 81 MG tablet, Take 81 mg by mouth daily., Disp: , Rfl:  .  atorvastatin (LIPITOR) 10 MG tablet, Take 1 tablet (10 mg total) by mouth daily., Disp: 90 tablet, Rfl: 1 .  lisinopril-hydrochlorothiazide (PRINZIDE,ZESTORETIC) 10-12.5 MG tablet, Take 1 tablet by mouth daily., Disp: 90 tablet, Rfl: 1 .  Omega 3 1000 MG CAPS, Take 1 capsule (1,000 mg total) by mouth 2 (two) times daily., Disp: 90 each, Rfl: 3 .   atorvastatin (LIPITOR) 10 MG tablet, TAKE 1 TABLET BY MOUTH EVERY DAY, Disp: 30 tablet, Rfl: 2 .  fluticasone (FLONASE) 50 MCG/ACT nasal spray, Place 1 spray into both nostrils daily., Disp: 1 g, Rfl: 0 .  lisinopril-hydrochlorothiazide (PRINZIDE,ZESTORETIC) 10-12.5 MG tablet, TAKE 1 TABLET BY MOUTH EVERY DAY, Disp: 30 tablet, Rfl: 2 .  oseltamivir (TAMIFLU) 75 MG capsule, Take 1 capsule (75 mg total) by mouth every 12 (twelve) hours., Disp: 10 capsule, Rfl: 0  Allergies Sulfa antibiotics  Family History  Problem Relation Age of Onset  . Cancer Mother   . Diabetes Maternal Grandfather   . Stroke Maternal Grandfather     Social History Social History   Tobacco Use  . Smoking status: Current Some Day Smoker    Types: Cigarettes  . Smokeless tobacco: Never Used  . Tobacco comment: patient will pick up patches  Substance Use Topics  . Alcohol use: No  . Drug use: No    Review of Systems Constitutional: Positive fever ENT: As above Cardiovascular: Denies chest pain. Respiratory: Denies shortness of breath. Gastrointestinal: No abdominal pain.   Musculoskeletal: Negative for back pain. Skin: Negative for rash.   ____________________________________________   PHYSICAL EXAM:  VITAL SIGNS: ED Triage Vitals  Enc Vitals Group     BP 08/06/18 0821 (!) 144/88     Pulse Rate 08/06/18 0821 79     Resp 08/06/18 0821 18     Temp 08/06/18 0821 98.3 F (36.8 C)     Temp Source 08/06/18 0821 Oral     SpO2 08/06/18  0821 97 %     Weight 08/06/18 0818 175 lb (79.4 kg)     Height 08/06/18 0818 5' 5.5" (1.664 m)     Head Circumference --      Peak Flow --      Pain Score 08/06/18 0818 0     Pain Loc --      Pain Edu? --      Excl. in GC? --     Constitutional: Alert and oriented. Well appearing and in no acute distress. Eyes: Conjunctivae are normal. PERRL. EOMI. Head: Atraumatic. No sinus tenderness to palpation. No swelling. No erythema.  Ears: Left: Nontender, normal  canal, no erythema, slight effusion, otherwise normal TM.  Right: Nontender, normal canal, no erythema, normal TM.  Nose:Nasal congestion with clear rhinorrhea  Mouth/Throat: Mucous membranes are moist. No pharyngeal erythema. No tonsillar swelling or exudate.  Neck: No stridor.  No cervical spine tenderness to palpation. Hematological/Lymphatic/Immunilogical: No cervical lymphadenopathy. Cardiovascular: Normal rate, regular rhythm. Grossly normal heart sounds.  Good peripheral circulation. Respiratory: Normal respiratory effort.  No retractions. No wheezes, rales or rhonchi. Good air movement.  Musculoskeletal: Ambulatory with steady gait.  Neurologic:  Normal speech and language. No gait instability. Skin:  Skin appears warm, dry and intact. No rash noted. Psychiatric: Mood and affect are normal. Speech and behavior are normal. ___________________________________________   LABS (all labs ordered are listed, but only abnormal results are displayed)  Labs Reviewed - No data to display _____________________________________   PROCEDURES Procedures    INITIAL IMPRESSION / ASSESSMENT AND PLAN / ED COURSE  Pertinent labs & imaging results that were available during my care of the patient were reviewed by me and considered in my medical decision making (see chart for details).  Well-appearing patient.  No acute distress.  Suspect influenza illness.  Will treat with Tamiflu, Rx for Flonase given, continue over-the-counter cough congestion medication.  Rest, fluids, supportive care and fever management.Discussed indication, risks and benefits of medications with patient.  Work note for today and tomorrow given.  Discussed follow up with Primary care physician this week. Discussed follow up and return parameters including no resolution or any worsening concerns. Patient verbalized understanding and agreed to plan.   ____________________________________________   FINAL CLINICAL  IMPRESSION(S) / ED DIAGNOSES  Final diagnoses:  Influenza-like illness     ED Discharge Orders         Ordered    oseltamivir (TAMIFLU) 75 MG capsule  Every 12 hours     08/06/18 0842    fluticasone (FLONASE) 50 MCG/ACT nasal spray  Daily     08/06/18 0842           Note: This dictation was prepared with Dragon dictation along with smaller phrase technology. Any transcriptional errors that result from this process are unintentional.         Renford DillsMiller, Mable Dara, NP 08/06/18 1029

## 2018-08-06 NOTE — ED Triage Notes (Signed)
Pt c/o nasal congestion, sinus pain/pressure, bilateral ear pain left worse than right, cough, post nasal drainage. Started 3 days ago.

## 2018-08-06 NOTE — Discharge Instructions (Signed)
Take medication as prescribed. Rest. Drink plenty of fluids. Over the counter.   Follow up with your primary care physician this week as needed. Return to Urgent care for new or worsening concerns.

## 2018-09-02 ENCOUNTER — Other Ambulatory Visit: Payer: Self-pay | Admitting: Family Medicine

## 2018-09-02 DIAGNOSIS — I10 Essential (primary) hypertension: Secondary | ICD-10-CM

## 2018-09-02 DIAGNOSIS — E7800 Pure hypercholesterolemia, unspecified: Secondary | ICD-10-CM

## 2018-11-07 ENCOUNTER — Encounter: Payer: Self-pay | Admitting: Family Medicine

## 2018-11-21 ENCOUNTER — Ambulatory Visit (INDEPENDENT_AMBULATORY_CARE_PROVIDER_SITE_OTHER): Payer: BLUE CROSS/BLUE SHIELD | Admitting: Family Medicine

## 2018-11-21 ENCOUNTER — Other Ambulatory Visit: Payer: Self-pay

## 2018-11-21 ENCOUNTER — Encounter: Payer: Self-pay | Admitting: Family Medicine

## 2018-11-21 VITALS — BP 130/76 | HR 60 | Ht 65.0 in | Wt 176.0 lb

## 2018-11-21 DIAGNOSIS — R69 Illness, unspecified: Secondary | ICD-10-CM

## 2018-11-21 DIAGNOSIS — E7800 Pure hypercholesterolemia, unspecified: Secondary | ICD-10-CM | POA: Diagnosis not present

## 2018-11-21 DIAGNOSIS — I10 Essential (primary) hypertension: Secondary | ICD-10-CM

## 2018-11-21 DIAGNOSIS — J301 Allergic rhinitis due to pollen: Secondary | ICD-10-CM

## 2018-11-21 MED ORDER — LISINOPRIL-HYDROCHLOROTHIAZIDE 10-12.5 MG PO TABS: 1.0000 | ORAL_TABLET | Freq: Every day | ORAL | 1 refills | Status: DC

## 2018-11-21 MED ORDER — LORATADINE 10 MG PO TABS: 10.0000 mg | ORAL_TABLET | Freq: Every day | ORAL | 3 refills | Status: DC

## 2018-11-21 MED ORDER — ATORVASTATIN CALCIUM 10 MG PO TABS: 10.0000 mg | ORAL_TABLET | Freq: Every day | ORAL | 1 refills | Status: DC

## 2018-11-21 MED ORDER — FLUTICASONE PROPIONATE 50 MCG/ACT NA SUSP: 1.0000 | Freq: Every day | NASAL | 0 refills | Status: DC

## 2018-11-21 NOTE — Patient Instructions (Signed)
Mediterranean Diet  A Mediterranean diet refers to food and lifestyle choices that are based on the traditions of countries located on the [place]. This way of eating has been shown to help prevent certain conditions and improve outcomes for people who have chronic diseases, like kidney disease and heart disease.  What are tips for following this plan?  Lifestyle   Cook and eat meals together with your family, when possible.   Drink enough fluid to keep your urine clear or pale yellow.   Be physically active every day. This includes:  ? Aerobic exercise like running or swimming.  ? Leisure activities like gardening, walking, or housework.   Get 7-8 hours of sleep each night.   If recommended by your health care provider, drink red wine in moderation. This means 1 glass a day for nonpregnant women and 2 glasses a day for men. A glass of wine equals 5 oz (150 mL).  Reading food labels     Check the serving size of packaged foods. For foods such as rice and pasta, the serving size refers to the amount of cooked product, not dry.   Check the total fat in packaged foods. Avoid foods that have saturated fat or trans fats.   Check the ingredients list for added sugars, such as corn syrup.  Shopping   At the grocery store, buy most of your food from the areas near the walls of the store. This includes:  ? Fresh fruits and vegetables (produce).  ? Grains, beans, nuts, and seeds. Some of these may be available in unpackaged forms or large amounts (in bulk).  ? Fresh seafood.  ? Poultry and eggs.  ? Low-fat dairy products.   Buy whole ingredients instead of prepackaged foods.   Buy fresh fruits and vegetables in-season from local farmers markets.   Buy frozen fruits and vegetables in resealable bags.   If you do not have access to quality fresh seafood, buy precooked frozen shrimp or canned fish, such as tuna, salmon, or sardines.   Buy small amounts of raw or cooked vegetables, salads, or olives from  the deli or salad bar at your store.   Stock your pantry so you always have certain foods on hand, such as olive oil, canned tuna, canned tomatoes, rice, pasta, and beans.  Cooking   Cook foods with extra-virgin olive oil instead of using butter or other vegetable oils.   Have meat as a side dish, and have vegetables or grains as your main dish. This means having meat in small portions or adding small amounts of meat to foods like pasta or stew.   Use beans or vegetables instead of meat in common dishes like chili or lasagna.   Experiment with different cooking methods. Try roasting or broiling vegetables instead of steaming or sauteing them.   Add frozen vegetables to soups, stews, pasta, or rice.   Add nuts or seeds for added healthy fat at each meal. You can add these to yogurt, salads, or vegetable dishes.   Marinate fish or vegetables using olive oil, lemon juice, garlic, and fresh herbs.  Meal planning     Plan to eat 1 vegetarian meal one day each week. Try to work up to 2 vegetarian meals, if possible.   Eat seafood 2 or more times a week.   Have healthy snacks readily available, such as:  ? Vegetable sticks with hummus.  ? Greek yogurt.  ? Fruit and nut trail mix.   Eat balanced   meals throughout the week. This includes:  ? Fruit: 2-3 servings a day  ? Vegetables: 4-5 servings a day  ? Low-fat dairy: 2 servings a day  ? Fish, poultry, or lean meat: 1 serving a day  ? Beans and legumes: 2 or more servings a week  ? Nuts and seeds: 1-2 servings a day  ? Whole grains: 6-8 servings a day  ? Extra-virgin olive oil: 3-4 servings a day   Limit red meat and sweets to only a few servings a month  What are my food choices?   Mediterranean diet  ? Recommended  ? Grains: Whole-grain pasta. Brown rice. Bulgar wheat. Polenta. Couscous. Whole-wheat bread. Oatmeal. Quinoa.  ? Vegetables: Artichokes. Beets. Broccoli. Cabbage. Carrots. Eggplant. Green beans. Chard. Kale. Spinach. Onions. Leeks. Peas. Squash.  Tomatoes. Peppers. Radishes.  ? Fruits: Apples. Apricots. Avocado. Berries. Bananas. Cherries. Dates. Figs. Grapes. Lemons. Melon. Oranges. Peaches. Plums. Pomegranate.  ? Meats and other protein foods: Beans. Almonds. Sunflower seeds. Pine nuts. Peanuts. Cod. Salmon. Scallops. Shrimp. Tuna. Tilapia. Clams. Oysters. Eggs.  ? Dairy: Low-fat milk. Cheese. Greek yogurt.  ? Beverages: Water. Red wine. Herbal tea.  ? Fats and oils: Extra virgin olive oil. Avocado oil. Grape seed oil.  ? Sweets and desserts: Greek yogurt with honey. Baked apples. Poached pears. Trail mix.  ? Seasoning and other foods: Basil. Cilantro. Coriander. Cumin. Mint. Parsley. Sage. Rosemary. Tarragon. Garlic. Oregano. Thyme. Pepper. Balsalmic vinegar. Tahini. Hummus. Tomato sauce. Olives. Mushrooms.  ? Limit these  ? Grains: Prepackaged pasta or rice dishes. Prepackaged cereal with added sugar.  ? Vegetables: Deep fried potatoes (french fries).  ? Fruits: Fruit canned in syrup.  ? Meats and other protein foods: Beef. Pork. Lamb. Poultry with skin. Hot dogs. Bacon.  ? Dairy: Ice cream. Sour cream. Whole milk.  ? Beverages: Juice. Sugar-sweetened soft drinks. Beer. Liquor and spirits.  ? Fats and oils: Butter. Canola oil. Vegetable oil. Beef fat (tallow). Lard.  ? Sweets and desserts: Cookies. Cakes. Pies. Candy.  ? Seasoning and other foods: Mayonnaise. Premade sauces and marinades.  ? The items listed may not be a complete list. Talk with your dietitian about what dietary choices are right for you.  Summary   The Mediterranean diet includes both food and lifestyle choices.   Eat a variety of fresh fruits and vegetables, beans, nuts, seeds, and whole grains.   Limit the amount of red meat and sweets that you eat.   Talk with your health care provider about whether it is safe for you to drink red wine in moderation. This means 1 glass a day for nonpregnant women and 2 glasses a day for men. A glass of wine equals 5 oz (150 mL).  This information  is not intended to replace advice given to you by your health care provider. Make sure you discuss any questions you have with your health care provider.  Document Released: 03/10/2016 Document Revised: 04/12/2016 Document Reviewed: 03/10/2016  Elsevier Interactive Patient Education  2019 [employer]

## 2018-11-21 NOTE — Progress Notes (Signed)
Date:  11/21/2018   Name:  Julie Maldonado   DOB:  08/07/1955   MRN:  [id number]   Chief Complaint: Hypertension; Hyperlipidemia; and Allergic Rhinitis  (flonase nasal spray)  Hypertension  This is a chronic problem. The current episode started more than 1 year ago. The problem has been gradually improving since onset. The problem is controlled. Pertinent negatives include no anxiety, blurred vision, chest pain, headaches, malaise/fatigue, neck pain, orthopnea, palpitations, peripheral edema, PND, shortness of breath or sweats. There are no associated agents to hypertension. There are no known risk factors for coronary artery disease. Past treatments include ACE inhibitors and diuretics. The current treatment provides moderate improvement. There are no compliance problems.  There is no history of angina, kidney disease, CAD/MI, CVA, heart failure, left ventricular hypertrophy, PVD or retinopathy. There is no history of chronic renal disease, a hypertension causing med or renovascular disease.  Hyperlipidemia  This is a chronic problem. The current episode started more than 1 year ago. The problem is controlled. Recent lipid tests were reviewed and are normal. She has no history of chronic renal disease, diabetes, hypothyroidism, liver disease or obesity. Factors aggravating her hyperlipidemia include thiazides. Pertinent negatives include no chest pain, focal sensory loss, focal weakness, leg pain, myalgias or shortness of breath. She is currently on no antihyperlipidemic treatment. The current treatment provides moderate improvement of lipids. There are no compliance problems.  Risk factors for coronary artery disease include hypertension and dyslipidemia.  URI   This is a recurrent (for allergic rhintis) problem. The current episode started more than 1 year ago. The problem has been waxing and waning. Associated symptoms include congestion, rhinorrhea and sneezing. Pertinent negatives include no  abdominal pain, chest pain, coughing, diarrhea, dysuria, ear pain, headaches, joint pain, joint swelling, nausea, neck pain, plugged ear sensation, rash, sinus pain, sore throat, swollen glands, vomiting or wheezing. She has tried antihistamine (nasasal steroid) for the symptoms.    Review of Systems  Constitutional: Negative.  Negative for chills, fatigue, fever, malaise/fatigue and unexpected weight change.  HENT: Positive for congestion, rhinorrhea and sneezing. Negative for ear discharge, ear pain, sinus pressure, sinus pain and sore throat.   Eyes: Negative for blurred vision, photophobia, pain, discharge, redness and itching.  Respiratory: Negative for cough, shortness of breath, wheezing and stridor.   Cardiovascular: Negative for chest pain, palpitations, orthopnea and PND.  Gastrointestinal: Negative for abdominal pain, blood in stool, constipation, diarrhea, nausea and vomiting.  Endocrine: Negative for cold intolerance, heat intolerance, polydipsia, polyphagia and polyuria.  Genitourinary: Negative for dysuria, flank pain, frequency, hematuria, menstrual problem, pelvic pain, urgency, vaginal bleeding and vaginal discharge.  Musculoskeletal: Negative for arthralgias, back pain, joint pain, myalgias and neck pain.  Skin: Negative for rash.  Allergic/Immunologic: Negative for environmental allergies and food allergies.  Neurological: Negative for dizziness, focal weakness, weakness, light-headedness, numbness and headaches.  Hematological: Negative for adenopathy. Does not bruise/bleed easily.  Psychiatric/Behavioral: Negative for dysphoric mood. The patient is not nervous/anxious.     Patient Active Problem List   Diagnosis Date Noted   Essential hypertension 04/20/2016   Hyperlipidemia 04/20/2016   Acute maxillary sinusitis 04/20/2016   Cigarette nicotine dependence with nicotine-induced disorder 04/20/2016    Allergies  Allergen Reactions   Sulfa Antibiotics Hives     Past Surgical History:  Procedure Laterality Date   ABDOMINAL HYSTERECTOMY     BACK SURGERY      Social History   Tobacco Use   Smoking status: Former Smoker  Types: Cigarettes    Last attempt to quit: 11/13/2018    Years since quitting: 0.0   Smokeless tobacco: Never Used   Tobacco comment: patient will pick up patches  Substance Use Topics   Alcohol use: No   Drug use: No     Medication list has been reviewed and updated.  Current Meds  Medication Sig   aspirin 81 MG tablet Take 81 mg by mouth daily.   atorvastatin (LIPITOR) 10 MG tablet Take 1 tablet (10 mg total) by mouth daily.   fluticasone (FLONASE) 50 MCG/ACT nasal spray Place 1 spray into both nostrils daily.   lisinopril-hydrochlorothiazide (PRINZIDE,ZESTORETIC) 10-12.5 MG tablet Take 1 tablet by mouth daily.   loratadine (CLARITIN) 10 MG tablet Take 10 mg by mouth daily. otc   Omega 3 1000 MG CAPS Take 1 capsule (1,000 mg total) by mouth 2 (two) times daily.    PHQ 2/9 Scores 11/21/2018 05/22/2018 03/22/2017 01/13/2016  PHQ - 2 Score 0 0 0 0  PHQ- 9 Score 0 - 1 -    BP Readings from Last 3 Encounters:  11/21/18 130/76  08/06/18 (!) 144/88  05/22/18 130/88    Physical Exam Vitals signs and nursing note reviewed.  Constitutional:      General: She is not in acute distress.    Appearance: She is not diaphoretic.  HENT:     Head: Normocephalic and atraumatic.     Right Ear: External ear normal.     Left Ear: External ear normal.     Nose: Nose normal.  Eyes:     General:        Right eye: No discharge.        Left eye: No discharge.     Conjunctiva/sclera: Conjunctivae normal.     Pupils: Pupils are equal, round, and reactive to light.  Neck:     Musculoskeletal: Normal range of motion and neck supple.     Thyroid: No thyromegaly.     Vascular: No JVD.  Cardiovascular:     Rate and Rhythm: Normal rate and regular rhythm.     Heart sounds: Normal heart sounds. No murmur. No  friction rub. No gallop.   Pulmonary:     Effort: Pulmonary effort is normal.     Breath sounds: Normal breath sounds. No wheezing or rhonchi.  Abdominal:     General: Bowel sounds are normal.     Palpations: Abdomen is soft. There is no mass.     Tenderness: There is no abdominal tenderness. There is no guarding.  Musculoskeletal: Normal range of motion.  Lymphadenopathy:     Cervical: No cervical adenopathy.  Skin:    General: Skin is warm and dry.  Neurological:     Mental Status: She is alert.     Deep Tendon Reflexes: Reflexes are normal and symmetric.     Wt Readings from Last 3 Encounters:  11/21/18 176 lb (79.8 kg)  08/06/18 175 lb (79.4 kg)  05/22/18 169 lb (76.7 kg)    BP 130/76    Pulse 60    Ht  (1.651 m)    Wt 176 lb (79.8 kg)    BMI 29.29 kg/m   Assessment and Plan: 1. Pure hypercholesterolemia Chronic.  Controlled.  Continue atorvastatin 10 mg once a day will check current status with lipid panel. - atorvastatin (LIPITOR) 10 MG tablet; Take 1 tablet (10 mg total) by mouth daily.  Dispense: 90 tablet; Refill: 1 - Lipid Panel With LDL/HDL Ratio  2. Essential hypertension Chronic.  Controlled.  Continue lisinopril hydrochlorothiazide 10-12 0.5 once a day.  Will check renal function panel. - lisinopril-hydrochlorothiazide (ZESTORETIC) 10-12.5 MG tablet; Take 1 tablet by mouth daily.  Dispense: 90 tablet; Refill: 1 - Renal Function Panel  3. Taking medication for chronic disease Patient currently on a statin and we will check a hepatic function panel to evaluate for hepatotoxicity. - Hepatic function panel  4. Seasonal allergic rhinitis due to pollen She has a history of seasonal allergic rhinitis.  Will continue fluticasone nasal spray. - fluticasone (FLONASE) 50 MCG/ACT nasal spray; Place 1 spray into both nostrils daily.  Dispense: 1 g; Refill: 0  .

## 2018-11-22 LAB — HEPATIC FUNCTION PANEL
ALT: 25 IU/L (ref 0–32)
AST: 18 IU/L (ref 0–40)
Alkaline Phosphatase: 86 IU/L (ref 39–117)
Bilirubin Total: 0.2 mg/dL (ref 0.0–1.2)
Bilirubin, Direct: 0.08 mg/dL (ref 0.00–0.40)
Total Protein: 7.2 g/dL (ref 6.0–8.5)

## 2018-11-22 LAB — LIPID PANEL WITH LDL/HDL RATIO
Cholesterol, Total: 199 mg/dL (ref 100–199)
HDL: 61 mg/dL (ref >39)
LDL Calculated: 115 mg/dL — ABNORMAL HIGH (ref 0–99)
LDl/HDL Ratio: 1.9 ratio (ref 0.0–3.2)
Triglycerides: 117 mg/dL (ref 0–149)
VLDL Cholesterol Cal: 23 mg/dL (ref 5–40)

## 2018-11-22 LAB — RENAL FUNCTION PANEL
Albumin: 4.9 g/dL — ABNORMAL HIGH (ref 3.8–4.8)
BUN/Creatinine Ratio: 21 (ref 12–28)
BUN: 23 mg/dL (ref 8–27)
CO2: 23 mmol/L (ref 20–29)
Calcium: 9.7 mg/dL (ref 8.7–10.3)
Chloride: 98 mmol/L (ref 96–106)
Creatinine, Ser: 1.11 mg/dL — ABNORMAL HIGH (ref 0.57–1.00)
GFR calc Af Amer: 61 mL/min/{1.73_m2} (ref >59)
GFR calc non Af Amer: 53 mL/min/{1.73_m2} — ABNORMAL LOW (ref >59)
Glucose: 114 mg/dL — ABNORMAL HIGH (ref 65–99)
Phosphorus: 3.6 mg/dL (ref 3.0–4.3)
Potassium: 4.4 mmol/L (ref 3.5–5.2)
Sodium: 139 mmol/L (ref 134–144)

## 2019-01-01 ENCOUNTER — Other Ambulatory Visit: Payer: Self-pay

## 2019-01-01 ENCOUNTER — Encounter: Payer: Self-pay | Admitting: Family Medicine

## 2019-01-01 DIAGNOSIS — E7800 Pure hypercholesterolemia, unspecified: Secondary | ICD-10-CM

## 2019-01-01 DIAGNOSIS — J301 Allergic rhinitis due to pollen: Secondary | ICD-10-CM

## 2019-01-01 DIAGNOSIS — I10 Essential (primary) hypertension: Secondary | ICD-10-CM

## 2019-01-01 MED ORDER — LISINOPRIL-HYDROCHLOROTHIAZIDE 10-12.5 MG PO TABS: 1.0000 | ORAL_TABLET | Freq: Every day | ORAL | 1 refills | Status: DC

## 2019-01-01 MED ORDER — ATORVASTATIN CALCIUM 10 MG PO TABS: 10.0000 mg | ORAL_TABLET | Freq: Every day | ORAL | 1 refills | Status: DC

## 2019-01-01 MED ORDER — FLUTICASONE PROPIONATE 50 MCG/ACT NA SUSP: 1.0000 | Freq: Every day | NASAL | 0 refills | Status: DC

## 2019-03-04 ENCOUNTER — Other Ambulatory Visit: Payer: Self-pay

## 2019-03-04 DIAGNOSIS — J301 Allergic rhinitis due to pollen: Secondary | ICD-10-CM

## 2019-03-04 MED ORDER — FLUTICASONE PROPIONATE 50 MCG/ACT NA SUSP: 1.0000 | Freq: Every day | NASAL | 0 refills | Status: DC

## 2019-05-29 ENCOUNTER — Ambulatory Visit: Payer: BLUE CROSS/BLUE SHIELD | Admitting: Family Medicine

## 2019-05-31 ENCOUNTER — Encounter: Payer: Self-pay | Admitting: Family Medicine

## 2019-05-31 ENCOUNTER — Other Ambulatory Visit: Payer: Self-pay

## 2019-05-31 ENCOUNTER — Ambulatory Visit (INDEPENDENT_AMBULATORY_CARE_PROVIDER_SITE_OTHER): Payer: BLUE CROSS/BLUE SHIELD | Admitting: Family Medicine

## 2019-05-31 VITALS — BP 118/78 | HR 74 | Ht 65.5 in | Wt 167.0 lb

## 2019-05-31 DIAGNOSIS — I10 Essential (primary) hypertension: Secondary | ICD-10-CM

## 2019-05-31 DIAGNOSIS — E7800 Pure hypercholesterolemia, unspecified: Secondary | ICD-10-CM

## 2019-05-31 DIAGNOSIS — Z23 Encounter for immunization: Secondary | ICD-10-CM

## 2019-05-31 MED ORDER — LISINOPRIL-HYDROCHLOROTHIAZIDE 10-12.5 MG PO TABS: 1.0000 | ORAL_TABLET | Freq: Every day | ORAL | 1 refills | Status: DC

## 2019-05-31 MED ORDER — ATORVASTATIN CALCIUM 10 MG PO TABS: 10.0000 mg | ORAL_TABLET | Freq: Every day | ORAL | 1 refills | Status: DC

## 2019-05-31 NOTE — Progress Notes (Signed)
Date:  05/31/2019   Name:  Julie Maldonado   DOB:  Dec 28, 1955   MRN:  [id number]   Chief Complaint: Hypertension (Flu shot.)  Hypertension This is a chronic problem. The current episode started more than 1 year ago. The problem has been gradually improving since onset. The problem is controlled. Pertinent negatives include no anxiety, blurred vision, chest pain, headaches, malaise/fatigue, neck pain, orthopnea, palpitations, peripheral edema, PND, shortness of breath or sweats. There are no associated agents to hypertension. Risk factors for coronary artery disease include dyslipidemia. Past treatments include ACE inhibitors and diuretics. The current treatment provides moderate improvement. There are no compliance problems.  There is no history of angina, kidney disease, CAD/MI, CVA, heart failure, left ventricular hypertrophy, PVD or retinopathy. There is no history of chronic renal disease, a hypertension causing med or renovascular disease.  Hyperlipidemia This is a chronic problem. The current episode started more than 1 year ago. The problem is controlled. Recent lipid tests were reviewed and are normal. She has no history of chronic renal disease, diabetes, hypothyroidism, liver disease, obesity or nephrotic syndrome. Pertinent negatives include no chest pain, focal weakness, leg pain, myalgias or shortness of breath. Current antihyperlipidemic treatment includes statins. The current treatment provides moderate improvement of lipids. There are no compliance problems.     Review of Systems  Constitutional: Negative.  Negative for chills, fatigue, fever, malaise/fatigue and unexpected weight change.  HENT: Negative for congestion, ear discharge, ear pain, rhinorrhea, sinus pressure, sneezing and sore throat.   Eyes: Negative for blurred vision, photophobia, pain, discharge, redness and itching.  Respiratory: Negative for cough, shortness of breath, wheezing and stridor.   Cardiovascular:  Negative for chest pain, palpitations, orthopnea and PND.  Gastrointestinal: Negative for abdominal pain, blood in stool, constipation, diarrhea, nausea and vomiting.  Endocrine: Negative for cold intolerance, heat intolerance, polydipsia, polyphagia and polyuria.  Genitourinary: Negative for dysuria, flank pain, frequency, hematuria, menstrual problem, pelvic pain, urgency, vaginal bleeding and vaginal discharge.  Musculoskeletal: Negative for arthralgias, back pain, myalgias and neck pain.  Skin: Negative for rash.  Allergic/Immunologic: Negative for environmental allergies and food allergies.  Neurological: Negative for dizziness, focal weakness, weakness, light-headedness, numbness and headaches.  Hematological: Negative for adenopathy. Does not bruise/bleed easily.  Psychiatric/Behavioral: Negative for dysphoric mood. The patient is not nervous/anxious.     Patient Active Problem List   Diagnosis Date Noted  . Essential hypertension 04/20/2016  . Hyperlipidemia 04/20/2016  . Acute maxillary sinusitis 04/20/2016  . Cigarette nicotine dependence with nicotine-induced disorder 04/20/2016    Allergies  Allergen Reactions  . Sulfa Antibiotics Hives    Past Surgical History:  Procedure Laterality Date  . ABDOMINAL HYSTERECTOMY    . BACK SURGERY      Social History   Tobacco Use  . Smoking status: Former Smoker    Types: Cigarettes    Quit date: 11/13/2018    Years since quitting: 0.5  . Smokeless tobacco: Never Used  . Tobacco comment: patient will pick up patches  Substance Use Topics  . Alcohol use: No  . Drug use: No     Medication list has been reviewed and updated.  Current Meds  Medication Sig  . aspirin 81 MG tablet Take 81 mg by mouth daily.  Marland Kitchen atorvastatin (LIPITOR) 10 MG tablet Take 1 tablet (10 mg total) by mouth daily.  . fluticasone (FLONASE) 50 MCG/ACT nasal spray Place 1 spray into both nostrils daily.  Marland Kitchen lisinopril-hydrochlorothiazide (ZESTORETIC)  10-12.5 MG tablet  Take 1 tablet by mouth daily.  Marland Kitchen loratadine (CLARITIN) 10 MG tablet Take 1 tablet (10 mg total) by mouth daily. otc  . Omega 3 1000 MG CAPS Take 1 capsule (1,000 mg total) by mouth 2 (two) times daily.    PHQ 2/9 Scores 05/31/2019 11/21/2018 05/22/2018 03/22/2017  PHQ - 2 Score 0 0 0 0  PHQ- 9 Score - 0 - 1    BP Readings from Last 3 Encounters:  05/31/19 118/78  11/21/18 130/76  08/06/18 (!) 144/88    Physical Exam Vitals signs and nursing note reviewed.  Constitutional:      General: She is not in acute distress.    Appearance: She is not diaphoretic.  HENT:     Head: Normocephalic and atraumatic.     Right Ear: Tympanic membrane, ear canal and external ear normal.     Left Ear: Tympanic membrane, ear canal and external ear normal.     Nose: Nose normal. No congestion or rhinorrhea.     Mouth/Throat:     Mouth: Mucous membranes are moist.  Eyes:     General:        Right eye: No discharge.        Left eye: No discharge.     Conjunctiva/sclera: Conjunctivae normal.     Pupils: Pupils are equal, round, and reactive to light.  Neck:     Musculoskeletal: Normal range of motion and neck supple. No muscular tenderness.     Thyroid: No thyromegaly.     Vascular: No carotid bruit or JVD.  Cardiovascular:     Rate and Rhythm: Normal rate and regular rhythm.     Heart sounds: Normal heart sounds. No murmur. No friction rub. No gallop.   Pulmonary:     Effort: Pulmonary effort is normal.     Breath sounds: Normal breath sounds. No wheezing, rhonchi or rales.  Abdominal:     General: Bowel sounds are normal.     Palpations: Abdomen is soft. There is no mass.     Tenderness: There is no abdominal tenderness. There is no right CVA tenderness, left CVA tenderness or guarding.  Musculoskeletal: Normal range of motion.  Lymphadenopathy:     Cervical: No cervical adenopathy.  Skin:    General: Skin is warm and dry.     Capillary Refill: Capillary refill takes less  than 2 seconds.  Neurological:     Mental Status: She is alert.     Deep Tendon Reflexes: Reflexes are normal and symmetric.     Wt Readings from Last 3 Encounters:  05/31/19 167 lb (75.8 kg)  11/21/18 176 lb (79.8 kg)  08/06/18 175 lb (79.4 kg)    BP 118/78   Pulse 74   Ht 5' 5.5" (1.664 m)   Wt 167 lb (75.8 kg)   SpO2 97%   BMI 27.37 kg/m   Assessment and Plan:  1. Essential hypertension Chronic.  Controlled.  Stable.  Continue lisinopril hydrochlorothiazide 10-12 0.5 once a day.  Will check renal function panel.  Will recheck patient in 6 months. - lisinopril-hydrochlorothiazide (ZESTORETIC) 10-12.5 MG tablet; Take 1 tablet by mouth daily.  Dispense: 90 tablet; Refill: 1 - Renal Function Panel  2. Pure hypercholesterolemia Chronic.  Controlled.  Stable.  Continue atorvastatin 10 mg once a day.  Will check lipid panel for further control. - atorvastatin (LIPITOR) 10 MG tablet; Take 1 tablet (10 mg total) by mouth daily.  Dispense: 90 tablet; Refill: 1 - Lipid panel  3.  Influenza vaccine needed Discussed and administered. - Flu Vaccine QUAD 6+ mos PF IM (Fluarix Quad PF)

## 2019-05-31 NOTE — Patient Instructions (Signed)

## 2019-06-01 LAB — LIPID PANEL
Chol/HDL Ratio: 3.7 ratio (ref 0.0–4.4)
Cholesterol, Total: 201 mg/dL — ABNORMAL HIGH (ref 100–199)
HDL: 54 mg/dL (ref >39)
LDL Chol Calc (NIH): 121 mg/dL — ABNORMAL HIGH (ref 0–99)
Triglycerides: 148 mg/dL (ref 0–149)
VLDL Cholesterol Cal: 26 mg/dL (ref 5–40)

## 2019-06-01 LAB — RENAL FUNCTION PANEL
Albumin: 4.8 g/dL (ref 3.8–4.8)
BUN/Creatinine Ratio: 15 (ref 12–28)
BUN: 17 mg/dL (ref 8–27)
CO2: 23 mmol/L (ref 20–29)
Calcium: 9.9 mg/dL (ref 8.7–10.3)
Chloride: 103 mmol/L (ref 96–106)
Creatinine, Ser: 1.11 mg/dL — ABNORMAL HIGH (ref 0.57–1.00)
GFR calc Af Amer: 61 mL/min/{1.73_m2} (ref >59)
GFR calc non Af Amer: 53 mL/min/{1.73_m2} — ABNORMAL LOW (ref >59)
Glucose: 104 mg/dL — ABNORMAL HIGH (ref 65–99)
Phosphorus: 3.1 mg/dL (ref 3.0–4.3)
Potassium: 4.3 mmol/L (ref 3.5–5.2)
Sodium: 140 mmol/L (ref 134–144)

## 2019-10-04 ENCOUNTER — Other Ambulatory Visit: Payer: Self-pay

## 2019-10-04 MED ORDER — LORATADINE 10 MG PO TABS: 10.0000 mg | ORAL_TABLET | Freq: Every day | ORAL | 0 refills | Status: DC

## 2019-11-29 ENCOUNTER — Ambulatory Visit (INDEPENDENT_AMBULATORY_CARE_PROVIDER_SITE_OTHER): Payer: BLUE CROSS/BLUE SHIELD | Admitting: Family Medicine

## 2019-11-29 ENCOUNTER — Other Ambulatory Visit: Payer: Self-pay

## 2019-11-29 ENCOUNTER — Encounter: Payer: Self-pay | Admitting: Family Medicine

## 2019-11-29 VITALS — BP 130/80 | HR 64 | Ht 65.5 in | Wt 165.0 lb

## 2019-11-29 DIAGNOSIS — J301 Allergic rhinitis due to pollen: Secondary | ICD-10-CM

## 2019-11-29 DIAGNOSIS — E7800 Pure hypercholesterolemia, unspecified: Secondary | ICD-10-CM | POA: Diagnosis not present

## 2019-11-29 DIAGNOSIS — I10 Essential (primary) hypertension: Secondary | ICD-10-CM

## 2019-11-29 MED ORDER — ATORVASTATIN CALCIUM 10 MG PO TABS: 10.0000 mg | ORAL_TABLET | Freq: Every day | ORAL | 1 refills | Status: DC

## 2019-11-29 MED ORDER — LISINOPRIL-HYDROCHLOROTHIAZIDE 10-12.5 MG PO TABS: 1.0000 | ORAL_TABLET | Freq: Every day | ORAL | 1 refills | Status: DC

## 2019-11-29 MED ORDER — FLUTICASONE PROPIONATE 50 MCG/ACT NA SUSP: 1.0000 | Freq: Every day | NASAL | 11 refills | Status: DC

## 2019-11-29 NOTE — Progress Notes (Signed)
Date:  11/29/2019   Name:  Julie Maldonado   DOB:  01-Apr-1956   MRN:  [id number]   Chief Complaint: Hypertension, Hyperlipidemia, and Allergic Rhinitis   Hypertension This is a chronic problem. The current episode started more than 1 year ago. The problem has been gradually improving since onset. The problem is controlled. Pertinent negatives include no anxiety, blurred vision, chest pain, headaches, malaise/fatigue, neck pain, orthopnea, palpitations, peripheral edema, PND, shortness of breath or sweats. There are no associated agents to hypertension. There are no known risk factors for coronary artery disease. Past treatments include ACE inhibitors and diuretics. The current treatment provides moderate improvement. There are no compliance problems.  There is no history of angina, kidney disease, CAD/MI, CVA, heart failure, left ventricular hypertrophy, PVD or retinopathy. There is no history of chronic renal disease, a hypertension causing med or renovascular disease.  Hyperlipidemia This is a chronic problem. The current episode started more than 1 year ago. The problem is controlled. Recent lipid tests were reviewed and are normal. Exacerbating diseases include diabetes. She has no history of chronic renal disease, hypothyroidism, liver disease, obesity or nephrotic syndrome. There are no known factors aggravating her hyperlipidemia. Pertinent negatives include no chest pain, myalgias or shortness of breath. Current antihyperlipidemic treatment includes statins. The current treatment provides moderate improvement of lipids. There are no compliance problems.  Risk factors for coronary artery disease include dyslipidemia.    Lab Results  Component Value Date   CREATININE 1.11 (H) 05/31/2019   BUN 17 05/31/2019   NA 140 05/31/2019   K 4.3 05/31/2019   CL 103 05/31/2019   CO2 23 05/31/2019   Lab Results  Component Value Date   CHOL 201 (H) 05/31/2019   HDL 54 05/31/2019   LDLCALC 121 (H)  05/31/2019   TRIG 148 05/31/2019   CHOLHDL 3.7 05/31/2019   No results found for: TSH No results found for: HGBA1C Lab Results  Component Value Date   WBC 8.7 08/15/2011   HGB 14.3 08/15/2011   HCT 42.6 08/15/2011   MCV 92 08/15/2011   PLT 191 08/15/2011   Lab Results  Component Value Date   ALT 25 11/21/2018   AST 18 11/21/2018   ALKPHOS 86 11/21/2018   BILITOT 0.2 11/21/2018     Review of Systems  Constitutional: Negative.  Negative for chills, fatigue, fever, malaise/fatigue and unexpected weight change.  HENT: Negative for congestion, ear discharge, ear pain, rhinorrhea, sinus pressure, sneezing and sore throat.   Eyes: Negative for blurred vision, photophobia, pain, discharge, redness and itching.  Respiratory: Negative for cough, shortness of breath, wheezing and stridor.   Cardiovascular: Negative for chest pain, palpitations, orthopnea and PND.  Gastrointestinal: Negative for abdominal pain, blood in stool, constipation, diarrhea, nausea and vomiting.  Endocrine: Negative for cold intolerance, heat intolerance, polydipsia, polyphagia and polyuria.  Genitourinary: Negative for dysuria, flank pain, frequency, hematuria, menstrual problem, pelvic pain, urgency, vaginal bleeding and vaginal discharge.  Musculoskeletal: Negative for arthralgias, back pain, myalgias and neck pain.  Skin: Negative for rash.  Allergic/Immunologic: Negative for environmental allergies and food allergies.  Neurological: Negative for dizziness, weakness, light-headedness, numbness and headaches.  Hematological: Negative for adenopathy. Does not bruise/bleed easily.  Psychiatric/Behavioral: Negative for dysphoric mood. The patient is not nervous/anxious.     Patient Active Problem List   Diagnosis Date Noted  . Essential hypertension 04/20/2016  . Hyperlipidemia 04/20/2016  . Acute maxillary sinusitis 04/20/2016    Allergies  Allergen Reactions  .  Sulfa Antibiotics Hives    Past  Surgical History:  Procedure Laterality Date  . ABDOMINAL HYSTERECTOMY    . BACK SURGERY      Social History   Tobacco Use  . Smoking status: Former Smoker    Types: Cigarettes    Quit date: 11/13/2018    Years since quitting: 1.0  . Smokeless tobacco: Never Used  . Tobacco comment: patient will pick up patches  Substance Use Topics  . Alcohol use: No  . Drug use: No     Medication list has been reviewed and updated.  Current Meds  Medication Sig  . aspirin 81 MG tablet Take 81 mg by mouth daily.  Marland Kitchen atorvastatin (LIPITOR) 10 MG tablet Take 1 tablet (10 mg total) by mouth daily.  . fluticasone (FLONASE) 50 MCG/ACT nasal spray Place 1 spray into both nostrils daily.  Marland Kitchen lisinopril-hydrochlorothiazide (ZESTORETIC) 10-12.5 MG tablet Take 1 tablet by mouth daily.  Marland Kitchen loratadine (CLARITIN) 10 MG tablet Take 1 tablet (10 mg total) by mouth daily. otc  . Omega 3 1000 MG CAPS Take 1 capsule (1,000 mg total) by mouth 2 (two) times daily.    PHQ 2/9 Scores 11/29/2019 05/31/2019 11/21/2018 05/22/2018  PHQ - 2 Score 0 0 0 0  PHQ- 9 Score 0 - 0 -    BP Readings from Last 3 Encounters:  11/29/19 130/80  05/31/19 118/78  11/21/18 130/76    Physical Exam Vitals and nursing note reviewed.  Constitutional:      Appearance: She is well-developed.  HENT:     Head: Normocephalic.     Right Ear: Tympanic membrane, ear canal and external ear normal.     Left Ear: Tympanic membrane, ear canal and external ear normal.     Nose: Nose normal.     Mouth/Throat:     Mouth: Mucous membranes are moist.  Eyes:     General: Lids are everted, no foreign bodies appreciated. No scleral icterus.       Left eye: No foreign body or hordeolum.     Conjunctiva/sclera: Conjunctivae normal.     Right eye: Right conjunctiva is not injected.     Left eye: Left conjunctiva is not injected.     Pupils: Pupils are equal, round, and reactive to light.  Neck:     Thyroid: No thyromegaly.     Vascular: No  carotid bruit or JVD.     Trachea: No tracheal deviation.  Cardiovascular:     Rate and Rhythm: Normal rate and regular rhythm.     Heart sounds: Normal heart sounds. No murmur. No friction rub. No gallop.   Pulmonary:     Effort: Pulmonary effort is normal. No respiratory distress.     Breath sounds: Normal breath sounds. No wheezing, rhonchi or rales.  Abdominal:     General: Bowel sounds are normal. There is no distension.     Palpations: Abdomen is soft. There is no mass.     Tenderness: There is no abdominal tenderness. There is no guarding or rebound.  Musculoskeletal:        General: No tenderness. Normal range of motion.     Cervical back: Normal range of motion and neck supple.  Lymphadenopathy:     Cervical: No cervical adenopathy.  Skin:    General: Skin is warm.     Findings: No rash.  Neurological:     Mental Status: She is alert and oriented to person, place, and time.     Cranial  Nerves: No cranial nerve deficit.     Deep Tendon Reflexes: Reflexes normal.  Psychiatric:        Mood and Affect: Mood is not anxious or depressed.     Wt Readings from Last 3 Encounters:  11/29/19 165 lb (74.8 kg)  05/31/19 167 lb (75.8 kg)  11/21/18 176 lb (79.8 kg)    BP 130/80   Pulse 64   Ht 5' 5.5" (1.664 m)   Wt 165 lb (74.8 kg)   BMI 27.04 kg/m   Assessment and Plan:  1. Essential hypertension Chronic.  Controlled.  Stable.  Continue lisinopril hydrochlorothiazide 10-12 0.51 a day. - lisinopril-hydrochlorothiazide (ZESTORETIC) 10-12.5 MG tablet; Take 1 tablet by mouth daily.  Dispense: 90 tablet; Refill: 1  2. Pure hypercholesterolemia Chronic.  Controlled.  Stable.  Continue atorvastatin 10 mg 1 a day. - atorvastatin (LIPITOR) 10 MG tablet; Take 1 tablet (10 mg total) by mouth daily.  Dispense: 90 tablet; Refill: 1  3. Seasonal allergic rhinitis due to pollen Chronic.  Episodic.  Continue fluticasone nasal spray 1 spray both nostril daily. - fluticasone (FLONASE)  50 MCG/ACT nasal spray; Place 1 spray into both nostrils daily.  Dispense: 16 g; Refill: 11  We will hold on doing lab work until patient is able to establish with clinical practice that is taking her insurance.

## 2020-03-23 ENCOUNTER — Other Ambulatory Visit: Payer: Self-pay

## 2020-03-23 ENCOUNTER — Ambulatory Visit
Admission: RE | Admit: 2020-03-23 | Discharge: 2020-03-23 | Disposition: A | Discharge status: Home / Self Care | Payer: BLUE CROSS/BLUE SHIELD | Source: Ambulatory Visit | Attending: Internal Medicine | Admitting: Internal Medicine

## 2020-03-23 VITALS — BP 112/83 | HR 73 | Temp 99.4°F | Resp 18 | Ht 65.5 in | Wt 165.0 lb

## 2020-03-23 DIAGNOSIS — H6593 Unspecified nonsuppurative otitis media, bilateral: Secondary | ICD-10-CM | POA: Diagnosis not present

## 2020-03-23 DIAGNOSIS — R42 Dizziness and giddiness: Secondary | ICD-10-CM | POA: Diagnosis not present

## 2020-03-23 MED ORDER — GUAIFENESIN ER 600 MG PO TB12: 600.0000 mg | ORAL_TABLET | Freq: Two times a day (BID) | ORAL | 0 refills | Status: AC

## 2020-03-23 MED ORDER — MECLIZINE HCL 25 MG PO TABS: 25.0000 mg | ORAL_TABLET | Freq: Three times a day (TID) | ORAL | 0 refills | Status: DC | PRN

## 2020-03-23 MED ORDER — ONDANSETRON 4 MG PO TBDP: 4.0000 mg | ORAL_TABLET | Freq: Three times a day (TID) | ORAL | 0 refills | Status: DC | PRN

## 2020-03-23 NOTE — ED Triage Notes (Signed)
Patient states that she has been feeling dizzy since Friday. Patient states that feels like her head is "full of air." States that this has caused her to vomit.

## 2020-03-25 NOTE — ED Provider Notes (Signed)
[facility]    CSN: [id number] Arrival date & time: 03/23/20  1738      History   Chief Complaint Chief Complaint  Patient presents with  . Headache  . Dizziness    HPI Julie Maldonado is a 64 y.o. female comes to the urgent care with complaints of bilateral ear fullness, dizziness and feeling off balance.  Patient symptoms started 3 to 4 days ago.  No fever or chills.  No ear discharge.  Patient had nausea and episode of nonbilious nonbloody vomiting.  No generalized body aches.  She feels like her head is full of air.  She has some sinus pressure.  Patient has been using Flonase with no significant improvement.  The nasal discharge she has is clear with some yellowish tinge to it.   HPI  Past Medical History:  Diagnosis Date  . Hyperlipemia   . Hypertension   . Insomnia     Patient Active Problem List   Diagnosis Date Noted  . Essential hypertension 04/20/2016  . Hyperlipidemia 04/20/2016  . Acute maxillary sinusitis 04/20/2016    Past Surgical History:  Procedure Laterality Date  . ABDOMINAL HYSTERECTOMY    . BACK SURGERY      OB History   No obstetric history on file.      Home Medications    Prior to Admission medications   Medication Sig Start Date End Date Taking? Authorizing Provider  aspirin 81 MG tablet Take 81 mg by mouth daily.   Yes [provider]  atorvastatin (LIPITOR) 10 MG tablet Take 1 tablet (10 mg total) by mouth daily. 11/29/19  Yes Duanne Limerick, MD  fluticasone (FLONASE) 50 MCG/ACT nasal spray Place 1 spray into both nostrils daily. 11/29/19  Yes Duanne Limerick, MD  lisinopril-hydrochlorothiazide (ZESTORETIC) 10-12.5 MG tablet Take 1 tablet by mouth daily. 11/29/19  Yes Duanne Limerick, MD  loratadine (CLARITIN) 10 MG tablet Take 1 tablet (10 mg total) by mouth daily. otc 10/04/19  Yes Duanne Limerick, MD  Omega 3 1000 MG CAPS Take 1 capsule (1,000 mg total) by mouth 2 (two) times daily. 05/22/18  Yes Duanne Limerick,  MD  guaiFENesin (MUCINEX) 600 MG 12 hr tablet Take 1 tablet (600 mg total) by mouth 2 (two) times daily for 14 days. 03/23/20 04/06/20  Merrilee Jansky, MD  meclizine (ANTIVERT) 25 MG tablet Take 1 tablet (25 mg total) by mouth 3 (three) times daily as needed for dizziness. 03/23/20   Merrilee Jansky, MD  ondansetron (ZOFRAN ODT) 4 MG disintegrating tablet Take 1 tablet (4 mg total) by mouth every 8 (eight) hours as needed for nausea or vomiting. 03/23/20   Dela Sweeny, Britta Mccreedy, MD    Family History Family History  Problem Relation Age of Onset  . Cancer Mother   . Diabetes Maternal Grandfather   . Stroke Maternal Grandfather     Social History Social History   Tobacco Use  . Smoking status: Former Smoker    Types: Cigarettes    Quit date: 11/13/2018    Years since quitting: 1.3  . Smokeless tobacco: Never Used  . Tobacco comment: patient will pick up patches  Vaping Use  . Vaping Use: Never used  Substance Use Topics  . Alcohol use: No  . Drug use: No     Allergies   Sulfa antibiotics   Review of Systems Review of Systems  Constitutional: Negative for activity change, chills, fatigue and fever.  HENT: Positive for  congestion, sinus pressure and sinus pain. Negative for hearing loss, postnasal drip, rhinorrhea, sore throat, trouble swallowing and voice change.   Respiratory: Negative.   Gastrointestinal: Positive for nausea and vomiting. Negative for diarrhea.  Genitourinary: Negative.   Musculoskeletal: Negative.   Neurological: Positive for dizziness and light-headedness. Negative for seizures, speech difficulty and numbness.     Physical Exam Triage Vital Signs ED Triage Vitals  Enc Vitals Group     BP 03/23/20 1801 112/83     Pulse Rate 03/23/20 1801 73     Resp 03/23/20 1801 18     Temp 03/23/20 1801 99.4 F (37.4 C)     Temp Source 03/23/20 1801 Oral     SpO2 03/23/20 1801 97 %     Weight 03/23/20 1803 165 lb (74.8 kg)     Height 03/23/20 1803 5' 5.5"  (1.664 m)     Head Circumference --      Peak Flow --      Pain Score 03/23/20 1802 5     Pain Loc --      Pain Edu? --      Excl. in GC? --    No data found.  Updated Vital Signs BP 112/83 (BP Location: Left Arm)   Pulse 73   Temp 99.4 F (37.4 C) (Oral)   Resp 18   Ht 5' 5.5" (1.664 m)   Wt 74.8 kg   SpO2 97%   BMI 27.04 kg/m   Visual Acuity Right Eye Distance:   Left Eye Distance:   Bilateral Distance:    Right Eye Near:   Left Eye Near:    Bilateral Near:     Physical Exam Vitals reviewed.  Constitutional:      General: She is not in acute distress.    Appearance: She is well-developed. She is not ill-appearing.  Eyes:     Extraocular Movements: Extraocular movements intact.     Comments: Middle ear effusions bilaterally with no erythema of the tympanic membrane.  Cardiovascular:     Rate and Rhythm: Normal rate and regular rhythm.     Heart sounds: Normal heart sounds.  Pulmonary:     Effort: Pulmonary effort is normal. No respiratory distress.     Breath sounds: Normal breath sounds. No wheezing or rhonchi.  Abdominal:     General: There is no distension.     Palpations: Abdomen is soft.     Tenderness: There is no guarding.  Musculoskeletal:        General: No swelling or tenderness. Normal range of motion.     Cervical back: Normal range of motion.  Neurological:     Mental Status: She is alert.     GCS: GCS eye subscore is 4. GCS verbal subscore is 5. GCS motor subscore is 6.      UC Treatments / Results  Labs (all labs ordered are listed, but only abnormal results are displayed) Labs Reviewed - No data to display  EKG   Radiology No results found.  Procedures Procedures (including critical care time)  Medications Ordered in UC Medications - No data to display  Initial Impression / Assessment and Plan / UC Course  I have reviewed the triage vital signs and the nursing notes.  Pertinent labs & imaging results that were available  during my care of the patient were reviewed by me and considered in my medical decision making (see chart for details).    1.  Middle ear effusions with vertigo: Mucinex  600 mg twice daily Meclizine 25 mg every 8 hours as needed for dizziness Zofran as needed for nausea/vomiting. Return precautions given Patient is advised to be cautious about sudden changes in position since that may make her more dizzy. Final Clinical Impressions(s) / UC Diagnoses   Final diagnoses:  Vertigo  Fluid level behind tympanic membrane of both ears   Discharge Instructions   None    ED Prescriptions    Medication Sig Dispense Auth. Provider   guaiFENesin (MUCINEX) 600 MG 12 hr tablet Take 1 tablet (600 mg total) by mouth 2 (two) times daily for 14 days. 28 tablet Sayre Witherington, Britta Mccreedy, MD   meclizine (ANTIVERT) 25 MG tablet Take 1 tablet (25 mg total) by mouth 3 (three) times daily as needed for dizziness. 30 tablet Nayara Taplin, Britta Mccreedy, MD   ondansetron (ZOFRAN ODT) 4 MG disintegrating tablet Take 1 tablet (4 mg total) by mouth every 8 (eight) hours as needed for nausea or vomiting. 20 tablet Jilleen Essner, Britta Mccreedy, MD     PDMP not reviewed this encounter.   Merrilee Jansky, MD 03/25/20 [phone]

## 2020-04-22 ENCOUNTER — Ambulatory Visit: Payer: BLUE CROSS/BLUE SHIELD | Admitting: Family Medicine

## 2020-05-22 ENCOUNTER — Other Ambulatory Visit: Payer: Self-pay

## 2020-05-22 ENCOUNTER — Encounter: Payer: Self-pay | Admitting: Family Medicine

## 2020-05-22 ENCOUNTER — Ambulatory Visit (INDEPENDENT_AMBULATORY_CARE_PROVIDER_SITE_OTHER): Payer: Medicare Other | Admitting: Family Medicine

## 2020-05-22 VITALS — BP 138/64 | HR 66 | Ht 65.5 in | Wt 159.0 lb

## 2020-05-22 DIAGNOSIS — I10 Essential (primary) hypertension: Secondary | ICD-10-CM | POA: Diagnosis not present

## 2020-05-22 DIAGNOSIS — E7800 Pure hypercholesterolemia, unspecified: Secondary | ICD-10-CM

## 2020-05-22 DIAGNOSIS — J301 Allergic rhinitis due to pollen: Secondary | ICD-10-CM | POA: Diagnosis not present

## 2020-05-22 MED ORDER — OMEGA 3 1000 MG PO CAPS: 1.0000 | ORAL_CAPSULE | Freq: Two times a day (BID) | ORAL | Status: AC

## 2020-05-22 MED ORDER — ATORVASTATIN CALCIUM 10 MG PO TABS: 10.0000 mg | ORAL_TABLET | Freq: Every day | ORAL | 1 refills | Status: DC

## 2020-05-22 MED ORDER — LISINOPRIL-HYDROCHLOROTHIAZIDE 10-12.5 MG PO TABS: 1.0000 | ORAL_TABLET | Freq: Every day | ORAL | 1 refills | Status: DC

## 2020-05-22 MED ORDER — FLUTICASONE PROPIONATE 50 MCG/ACT NA SUSP: 1.0000 | Freq: Every day | NASAL | 11 refills | Status: DC

## 2020-05-22 NOTE — Progress Notes (Signed)
Date:  05/22/2020   Name:  Julie Maldonado   DOB:  1956-05-10   MRN:  [id number]   Chief Complaint: Hypertension and Hyperlipidemia  Hypertension This is a chronic problem. The current episode started more than 1 year ago. The problem has been gradually improving since onset. The problem is controlled. Pertinent negatives include no anxiety, blurred vision, chest pain, headaches, malaise/fatigue, neck pain, orthopnea, palpitations, peripheral edema, PND, shortness of breath or sweats. There are no associated agents to hypertension. Risk factors for coronary artery disease include dyslipidemia. Past treatments include ACE inhibitors and diuretics. The current treatment provides moderate improvement. There are no compliance problems.  There is no history of angina, kidney disease, CAD/MI, CVA, heart failure, left ventricular hypertrophy, PVD or retinopathy. There is no history of chronic renal disease, a hypertension causing med or renovascular disease.  Hyperlipidemia This is a chronic problem. The current episode started more than 1 year ago. The problem is controlled. Recent lipid tests were reviewed and are normal. She has no history of chronic renal disease, diabetes, hypothyroidism, liver disease, obesity or nephrotic syndrome. Pertinent negatives include no chest pain, myalgias or shortness of breath. Current antihyperlipidemic treatment includes statins. The current treatment provides moderate improvement of lipids. There are no compliance problems.  Risk factors for coronary artery disease include hypertension and dyslipidemia.  URI  This is a recurrent (for allergic rhinitis) problem. The problem has been waxing and waning. Associated symptoms include congestion and rhinorrhea. Pertinent negatives include no abdominal pain, chest pain, coughing, diarrhea, dysuria, ear pain, headaches, nausea, neck pain, rash, sneezing, sore throat, vomiting or wheezing. The treatment provided mild relief.     Lab Results  Component Value Date   CREATININE 1.11 (H) 05/31/2019   BUN 17 05/31/2019   NA 140 05/31/2019   K 4.3 05/31/2019   CL 103 05/31/2019   CO2 23 05/31/2019   Lab Results  Component Value Date   CHOL 201 (H) 05/31/2019   HDL 54 05/31/2019   LDLCALC 121 (H) 05/31/2019   TRIG 148 05/31/2019   CHOLHDL 3.7 05/31/2019   No results found for: TSH No results found for: HGBA1C Lab Results  Component Value Date   WBC 8.7 08/15/2011   HGB 14.3 08/15/2011   HCT 42.6 08/15/2011   MCV 92 08/15/2011   PLT 191 08/15/2011   Lab Results  Component Value Date   ALT 25 11/21/2018   AST 18 11/21/2018   ALKPHOS 86 11/21/2018   BILITOT 0.2 11/21/2018     Review of Systems  Constitutional: Negative.  Negative for chills, fatigue, fever, malaise/fatigue and unexpected weight change.  HENT: Positive for congestion and rhinorrhea. Negative for ear discharge, ear pain, sinus pressure, sneezing and sore throat.   Eyes: Negative for blurred vision, photophobia, pain, discharge, redness and itching.  Respiratory: Negative for cough, shortness of breath, wheezing and stridor.   Cardiovascular: Negative for chest pain, palpitations, orthopnea and PND.  Gastrointestinal: Negative for abdominal pain, blood in stool, constipation, diarrhea, nausea and vomiting.  Endocrine: Negative for cold intolerance, heat intolerance, polydipsia, polyphagia and polyuria.  Genitourinary: Negative for dysuria, flank pain, frequency, hematuria, menstrual problem, pelvic pain, urgency, vaginal bleeding and vaginal discharge.  Musculoskeletal: Negative for arthralgias, back pain, myalgias and neck pain.  Skin: Negative for rash.  Allergic/Immunologic: Negative for environmental allergies and food allergies.  Neurological: Negative for dizziness, weakness, light-headedness, numbness and headaches.  Hematological: Negative for adenopathy. Does not bruise/bleed easily.  Psychiatric/Behavioral: Negative for  dysphoric mood. The patient is not nervous/anxious.     Patient Active Problem List   Diagnosis Date Noted  . Essential hypertension 04/20/2016  . Hyperlipidemia 04/20/2016  . Acute maxillary sinusitis 04/20/2016    Allergies  Allergen Reactions  . Sulfa Antibiotics Hives    Other reaction(s): Unknown    Past Surgical History:  Procedure Laterality Date  . ABDOMINAL HYSTERECTOMY    . BACK SURGERY      Social History   Tobacco Use  . Smoking status: Former Smoker    Types: Cigarettes    Quit date: 11/13/2018    Years since quitting: 1.5  . Smokeless tobacco: Never Used  . Tobacco comment: patient will pick up patches  Vaping Use  . Vaping Use: Never used  Substance Use Topics  . Alcohol use: No  . Drug use: No     Medication list has been reviewed and updated.  Current Meds  Medication Sig  . aspirin 81 MG tablet Take 81 mg by mouth daily.  Marland Kitchen atorvastatin (LIPITOR) 10 MG tablet Take 1 tablet (10 mg total) by mouth daily.  . fluticasone (FLONASE) 50 MCG/ACT nasal spray Place 1 spray into both nostrils daily.  Marland Kitchen lisinopril-hydrochlorothiazide (ZESTORETIC) 10-12.5 MG tablet Take 1 tablet by mouth daily.  Marland Kitchen loratadine (CLARITIN) 10 MG tablet Take 1 tablet (10 mg total) by mouth daily. otc  . meclizine (ANTIVERT) 25 MG tablet Take 1 tablet (25 mg total) by mouth 3 (three) times daily as needed for dizziness.  . Omega 3 1000 MG CAPS Take 1 capsule (1,000 mg total) by mouth 2 (two) times daily.    PHQ 2/9 Scores 05/22/2020 11/29/2019 05/31/2019 11/21/2018  PHQ - 2 Score 0 0 0 0  PHQ- 9 Score 0 0 - 0    GAD 7 : Generalized Anxiety Score 05/22/2020 11/29/2019  Nervous, Anxious, on Edge 0 0  Control/stop worrying 0 0  Worry too much - different things 0 0  Trouble relaxing 0 0  Restless 0 0  Easily annoyed or irritable 0 0  Afraid - awful might happen 0 0  Total GAD 7 Score 0 0  Anxiety Difficulty Not difficult at all -    BP Readings from Last 3 Encounters:    05/22/20 138/64  03/23/20 112/83  11/29/19 130/80    Physical Exam Vitals and nursing note reviewed.  Constitutional:      Appearance: She is well-developed.  HENT:     Head: Normocephalic.     Right Ear: External ear normal.     Left Ear: External ear normal.  Eyes:     General: Lids are everted, no foreign bodies appreciated. No scleral icterus.       Left eye: No foreign body or hordeolum.     Conjunctiva/sclera: Conjunctivae normal.     Right eye: Right conjunctiva is not injected.     Left eye: Left conjunctiva is not injected.     Pupils: Pupils are equal, round, and reactive to light.  Neck:     Thyroid: No thyromegaly.     Vascular: No JVD.     Trachea: No tracheal deviation.  Cardiovascular:     Rate and Rhythm: Normal rate and regular rhythm.     Heart sounds: Normal heart sounds. No murmur heard.  No friction rub. No gallop.   Pulmonary:     Effort: Pulmonary effort is normal. No respiratory distress.     Breath sounds: Normal breath sounds. No wheezing or rales.  Abdominal:     General: Bowel sounds are normal.     Palpations: Abdomen is soft. There is no mass.     Tenderness: There is no abdominal tenderness. There is no guarding or rebound.  Musculoskeletal:        General: No tenderness. Normal range of motion.     Cervical back: Normal range of motion and neck supple.  Lymphadenopathy:     Cervical: No cervical adenopathy.  Skin:    General: Skin is warm.     Findings: No rash.  Neurological:     Mental Status: She is alert and oriented to person, place, and time.     Cranial Nerves: No cranial nerve deficit.     Deep Tendon Reflexes: Reflexes normal.  Psychiatric:        Mood and Affect: Mood is not anxious or depressed.     Wt Readings from Last 3 Encounters:  05/22/20 159 lb (72.1 kg)  03/23/20 165 lb (74.8 kg)  11/29/19 165 lb (74.8 kg)    BP 138/64 (BP Location: Left Arm, Patient Position: Sitting)   Pulse 66   Ht 5' 5.5" (1.664 m)    Wt 159 lb (72.1 kg)   SpO2 99%   BMI 26.06 kg/m   Assessment and Plan:  1. Essential hypertension .  Controlled.  Stable.  Blood pressure is 138/64.  Patient will continue lisinopril hydrochlorothiazide 10-12.5 mg daily.  Will check renal function panel for electrolytes that were abnormal during her recent hospital concerns with Covid and GFR. - lisinopril-hydrochlorothiazide (ZESTORETIC) 10-12.5 MG tablet; Take 1 tablet by mouth daily.  Dispense: 90 tablet; Refill: 1 - Renal Function Panel  2. Pure hypercholesterolemia Chronic.  Controlled.  Stable.  Continue atorvastatin 10 mg once a day and over-the-counter omega-3 1 g daily.  Will check lipid panel for current panel level. - atorvastatin (LIPITOR) 10 MG tablet; Take 1 tablet (10 mg total) by mouth daily.  Dispense: 90 tablet; Refill: 1 - Omega 3 1000 MG CAPS; Take 1 capsule (1,000 mg total) by mouth 2 (two) times daily. - Lipid Panel With LDL/HDL Ratio  3. Seasonal allergic rhinitis due to pollen Chronic episodic currently stable however is having to continue her fluticasone nasal spray 1 to 2 puffs in each nostril daily. - fluticasone (FLONASE) 50 MCG/ACT nasal spray; Place 1 spray into both nostrils daily.  Dispense: 16 g; Refill: 11

## 2020-05-23 LAB — RENAL FUNCTION PANEL
Albumin: 4.5 g/dL (ref 3.8–4.8)
BUN/Creatinine Ratio: 17 (ref 12–28)
BUN: 15 mg/dL (ref 8–27)
CO2: 22 mmol/L (ref 20–29)
Calcium: 9.6 mg/dL (ref 8.7–10.3)
Chloride: 103 mmol/L (ref 96–106)
Creatinine, Ser: 0.89 mg/dL (ref 0.57–1.00)
GFR calc Af Amer: 79 mL/min/{1.73_m2} (ref >59)
GFR calc non Af Amer: 68 mL/min/{1.73_m2} (ref >59)
Glucose: 115 mg/dL — ABNORMAL HIGH (ref 65–99)
Phosphorus: 3 mg/dL (ref 3.0–4.3)
Potassium: 4 mmol/L (ref 3.5–5.2)
Sodium: 141 mmol/L (ref 134–144)

## 2020-05-23 LAB — LIPID PANEL WITH LDL/HDL RATIO
Cholesterol, Total: 198 mg/dL (ref 100–199)
HDL: 57 mg/dL (ref >39)
LDL Chol Calc (NIH): 123 mg/dL — ABNORMAL HIGH (ref 0–99)
LDL/HDL Ratio: 2.2 ratio (ref 0.0–3.2)
Triglycerides: 99 mg/dL (ref 0–149)
VLDL Cholesterol Cal: 18 mg/dL (ref 5–40)

## 2020-10-11 DIAGNOSIS — S20211A Contusion of right front wall of thorax, initial encounter: Secondary | ICD-10-CM | POA: Diagnosis not present

## 2020-11-17 ENCOUNTER — Other Ambulatory Visit: Payer: Self-pay | Admitting: Family Medicine

## 2020-11-17 DIAGNOSIS — E7800 Pure hypercholesterolemia, unspecified: Secondary | ICD-10-CM

## 2020-11-17 DIAGNOSIS — I10 Essential (primary) hypertension: Secondary | ICD-10-CM

## 2020-11-23 ENCOUNTER — Ambulatory Visit: Payer: Medicare Other | Admitting: Family Medicine

## 2020-12-10 ENCOUNTER — Ambulatory Visit (INDEPENDENT_AMBULATORY_CARE_PROVIDER_SITE_OTHER): Payer: Medicare Other | Admitting: Family Medicine

## 2020-12-10 ENCOUNTER — Other Ambulatory Visit: Payer: Self-pay

## 2020-12-10 ENCOUNTER — Encounter: Payer: Self-pay | Admitting: Family Medicine

## 2020-12-10 ENCOUNTER — Telehealth: Payer: Self-pay

## 2020-12-10 VITALS — BP 128/80 | HR 64 | Ht 65.5 in | Wt 163.0 lb

## 2020-12-10 DIAGNOSIS — I10 Essential (primary) hypertension: Secondary | ICD-10-CM | POA: Diagnosis not present

## 2020-12-10 DIAGNOSIS — Z1231 Encounter for screening mammogram for malignant neoplasm of breast: Secondary | ICD-10-CM | POA: Diagnosis not present

## 2020-12-10 DIAGNOSIS — J301 Allergic rhinitis due to pollen: Secondary | ICD-10-CM | POA: Diagnosis not present

## 2020-12-10 DIAGNOSIS — Z23 Encounter for immunization: Secondary | ICD-10-CM | POA: Diagnosis not present

## 2020-12-10 DIAGNOSIS — E7800 Pure hypercholesterolemia, unspecified: Secondary | ICD-10-CM | POA: Diagnosis not present

## 2020-12-10 NOTE — Progress Notes (Signed)
Date:  12/10/2020   Name:  Julie Maldonado   DOB:  23-Mar-1956   MRN:  [id number]   Chief Complaint: Allergic Rhinitis , Hyperlipidemia, Hypertension, and pneum 13  Hyperlipidemia This is a chronic problem. The current episode started more than 1 year ago. The problem is controlled. Recent lipid tests were reviewed and are normal. She has no history of chronic renal disease, diabetes, hypothyroidism, liver disease, obesity or nephrotic syndrome. There are no known factors aggravating her hyperlipidemia. Pertinent negatives include no chest pain, focal sensory loss, focal weakness, leg pain, myalgias or shortness of breath. Current antihyperlipidemic treatment includes statins. The current treatment provides moderate improvement of lipids. There are no compliance problems.  There are no known risk factors for coronary artery disease.  Hypertension This is a chronic problem. The current episode started more than 1 year ago. The problem has been gradually improving since onset. The problem is controlled. Pertinent negatives include no anxiety, blurred vision, chest pain, headaches, malaise/fatigue, neck pain, orthopnea, palpitations, peripheral edema, PND, shortness of breath or sweats. There are no associated agents to hypertension. Risk factors for coronary artery disease include dyslipidemia. Past treatments include ACE inhibitors. There is no history of angina, kidney disease, CAD/MI, CVA, heart failure, left ventricular hypertrophy, PVD or retinopathy. There is no history of chronic renal disease.  URI  This is a recurrent (for allergic rhinitis) problem. The current episode started more than 1 year ago. The problem has been waxing and waning. Pertinent negatives include no abdominal pain, chest pain, congestion, coughing, diarrhea, dysuria, ear pain, headaches, nausea, neck pain, rash, rhinorrhea, sneezing, sore throat, vomiting or wheezing. She has tried antihistamine (nasal steroid) for the  symptoms. The treatment provided moderate relief.    Lab Results  Component Value Date   CREATININE 0.89 05/22/2020   BUN 15 05/22/2020   NA 141 05/22/2020   K 4.0 05/22/2020   CL 103 05/22/2020   CO2 22 05/22/2020   Lab Results  Component Value Date   CHOL 198 05/22/2020   HDL 57 05/22/2020   LDLCALC 123 (H) 05/22/2020   TRIG 99 05/22/2020   CHOLHDL 3.7 05/31/2019   No results found for: TSH No results found for: HGBA1C Lab Results  Component Value Date   WBC 8.7 08/15/2011   HGB 14.3 08/15/2011   HCT 42.6 08/15/2011   MCV 92 08/15/2011   PLT 191 08/15/2011   Lab Results  Component Value Date   ALT 25 11/21/2018   AST 18 11/21/2018   ALKPHOS 86 11/21/2018   BILITOT 0.2 11/21/2018     Review of Systems  Constitutional: Negative.  Negative for chills, fatigue, fever, malaise/fatigue and unexpected weight change.  HENT: Negative for congestion, ear discharge, ear pain, rhinorrhea, sinus pressure, sneezing and sore throat.   Eyes: Negative for blurred vision, photophobia, pain, discharge, redness and itching.  Respiratory: Negative for cough, shortness of breath, wheezing and stridor.   Cardiovascular: Negative for chest pain, palpitations, orthopnea and PND.  Gastrointestinal: Negative for abdominal pain, blood in stool, constipation, diarrhea, nausea and vomiting.  Endocrine: Negative for cold intolerance, heat intolerance, polydipsia, polyphagia and polyuria.  Genitourinary: Negative for dysuria, flank pain, frequency, hematuria, menstrual problem, pelvic pain, urgency, vaginal bleeding and vaginal discharge.  Musculoskeletal: Negative for arthralgias, back pain, myalgias and neck pain.  Skin: Negative for rash.  Allergic/Immunologic: Negative for environmental allergies and food allergies.  Neurological: Negative for dizziness, focal weakness, weakness, light-headedness, numbness and headaches.  Hematological: Negative for  adenopathy. Does not bruise/bleed easily.   Psychiatric/Behavioral: Negative for dysphoric mood. The patient is not nervous/anxious.     Patient Active Problem List   Diagnosis Date Noted  . Essential hypertension 04/20/2016  . Hyperlipidemia 04/20/2016  . Acute maxillary sinusitis 04/20/2016    Allergies  Allergen Reactions  . Sulfa Antibiotics Hives    Other reaction(s): Unknown    Past Surgical History:  Procedure Laterality Date  . ABDOMINAL HYSTERECTOMY    . BACK SURGERY      Social History   Tobacco Use  . Smoking status: Former Smoker    Types: Cigarettes    Quit date: 11/13/2018    Years since quitting: 2.0  . Smokeless tobacco: Never Used  . Tobacco comment: patient will pick up patches  Vaping Use  . Vaping Use: Never used  Substance Use Topics  . Alcohol use: No  . Drug use: No     Medication list has been reviewed and updated.  Current Meds  Medication Sig  . aspirin 81 MG tablet Take 81 mg by mouth daily.  Marland Kitchen atorvastatin (LIPITOR) 10 MG tablet TAKE 1 TABLET(10 MG) BY MOUTH DAILY  . fluticasone (FLONASE) 50 MCG/ACT nasal spray Place 1 spray into both nostrils daily.  Marland Kitchen lisinopril-hydrochlorothiazide (ZESTORETIC) 10-12.5 MG tablet TAKE 1 TABLET BY MOUTH DAILY  . loratadine (CLARITIN) 10 MG tablet Take 1 tablet (10 mg total) by mouth daily. otc  . meclizine (ANTIVERT) 25 MG tablet Take 1 tablet (25 mg total) by mouth 3 (three) times daily as needed for dizziness.  . Omega 3 1000 MG CAPS Take 1 capsule (1,000 mg total) by mouth 2 (two) times daily.    PHQ 2/9 Scores 12/10/2020 05/22/2020 11/29/2019 05/31/2019  PHQ - 2 Score 0 0 0 0  PHQ- 9 Score 0 0 0 -    GAD 7 : Generalized Anxiety Score 12/10/2020 05/22/2020 11/29/2019  Nervous, Anxious, on Edge 0 0 0  Control/stop worrying 0 0 0  Worry too much - different things 0 0 0  Trouble relaxing 0 0 0  Restless 0 0 0  Easily annoyed or irritable 0 0 0  Afraid - awful might happen 0 0 0  Total GAD 7 Score 0 0 0  Anxiety Difficulty - Not  difficult at all -    BP Readings from Last 3 Encounters:  12/10/20 128/80  05/22/20 138/64  03/23/20 112/83    Physical Exam Vitals and nursing note reviewed.  Constitutional:      Appearance: She is well-developed.  HENT:     Head: Normocephalic.     Right Ear: Tympanic membrane, ear canal and external ear normal.     Left Ear: Tympanic membrane, ear canal and external ear normal.  Eyes:     General: Lids are everted, no foreign bodies appreciated. No scleral icterus.       Left eye: No foreign body or hordeolum.     Conjunctiva/sclera: Conjunctivae normal.     Right eye: Right conjunctiva is not injected.     Left eye: Left conjunctiva is not injected.     Pupils: Pupils are equal, round, and reactive to light.  Neck:     Thyroid: No thyromegaly.     Vascular: No JVD.     Trachea: No tracheal deviation.  Cardiovascular:     Rate and Rhythm: Normal rate and regular rhythm.     Heart sounds: Normal heart sounds. No murmur heard. No friction rub. No gallop.   Pulmonary:  Effort: Pulmonary effort is normal. No respiratory distress.     Breath sounds: Normal breath sounds. No wheezing or rales.  Chest:  Breasts:     Right: Normal. No swelling, bleeding, inverted nipple, mass, nipple discharge, skin change, tenderness, axillary adenopathy or supraclavicular adenopathy.     Left: Normal. No swelling, bleeding, inverted nipple, mass, nipple discharge, skin change, tenderness, axillary adenopathy or supraclavicular adenopathy.    Abdominal:     General: Bowel sounds are normal.     Palpations: Abdomen is soft. There is no mass.     Tenderness: There is no abdominal tenderness. There is no guarding or rebound.  Musculoskeletal:        General: No tenderness. Normal range of motion.     Cervical back: Normal range of motion and neck supple.  Lymphadenopathy:     Cervical: No cervical adenopathy.     Upper Body:     Right upper body: No supraclavicular or axillary  adenopathy.     Left upper body: No supraclavicular or axillary adenopathy.  Skin:    General: Skin is warm.     Findings: No rash.  Neurological:     Mental Status: She is alert and oriented to person, place, and time.     Cranial Nerves: No cranial nerve deficit.     Deep Tendon Reflexes: Reflexes normal.  Psychiatric:        Mood and Affect: Mood is not anxious or depressed.     Wt Readings from Last 3 Encounters:  12/10/20 163 lb (73.9 kg)  05/22/20 159 lb (72.1 kg)  03/23/20 165 lb (74.8 kg)    BP 128/80   Pulse 64   Ht 5' 5.5" (1.664 m)   Wt 163 lb (73.9 kg)   BMI 26.71 kg/m   Assessment and Plan:  1. Essential hypertension Chronic.  Controlled.  Stable.  Blood pressure today is 128/80.  We will continue lisinopril hydrochlorothiazide 10-12 0.5 once a day.  Will check CMP. - Comprehensive Metabolic Panel (CMET)  2. Pure hypercholesterolemia Chronic.  Controlled.  Stable.  Continue atorvastatin 10 mg once a day.  Will check lipid panel. - Lipid Panel With LDL/HDL Ratio  3. Seasonal allergic rhinitis due to pollen Chronic.  Controlled.  Stable.  Continue Flonase and Claritin.  4. Breast cancer screening by mammogram Bilateral manual breast exam was unremarkable for any masses or abnormalities.  Mammogram has been scheduled. - MM 3D SCREEN BREAST BILATERAL; Future

## 2020-12-10 NOTE — Telephone Encounter (Signed)
Called with mammo appt on Tuesday 12/15/20 @ 10:00 in [place]

## 2020-12-10 NOTE — Patient Instructions (Signed)

## 2020-12-11 LAB — COMPREHENSIVE METABOLIC PANEL
ALT: 10 IU/L (ref 0–32)
AST: 16 IU/L (ref 0–40)
Albumin/Globulin Ratio: 1.8 (ref 1.2–2.2)
Albumin: 4.8 g/dL (ref 3.8–4.8)
Alkaline Phosphatase: 78 IU/L (ref 44–121)
BUN/Creatinine Ratio: 17 (ref 12–28)
BUN: 19 mg/dL (ref 8–27)
Bilirubin Total: 0.2 mg/dL (ref 0.0–1.2)
CO2: 22 mmol/L (ref 20–29)
Calcium: 10.5 mg/dL — ABNORMAL HIGH (ref 8.7–10.3)
Chloride: 105 mmol/L (ref 96–106)
Creatinine, Ser: 1.12 mg/dL — ABNORMAL HIGH (ref 0.57–1.00)
Globulin, Total: 2.6 g/dL (ref 1.5–4.5)
Glucose: 96 mg/dL (ref 65–99)
Potassium: 4.4 mmol/L (ref 3.5–5.2)
Sodium: 148 mmol/L — ABNORMAL HIGH (ref 134–144)
Total Protein: 7.4 g/dL (ref 6.0–8.5)
eGFR: 55 mL/min/{1.73_m2} — ABNORMAL LOW (ref >59)

## 2020-12-11 LAB — LIPID PANEL WITH LDL/HDL RATIO
Cholesterol, Total: 196 mg/dL (ref 100–199)
HDL: 60 mg/dL (ref >39)
LDL Chol Calc (NIH): 120 mg/dL — ABNORMAL HIGH (ref 0–99)
LDL/HDL Ratio: 2 ratio (ref 0.0–3.2)
Triglycerides: 87 mg/dL (ref 0–149)
VLDL Cholesterol Cal: 16 mg/dL (ref 5–40)

## 2020-12-15 ENCOUNTER — Other Ambulatory Visit: Payer: Self-pay

## 2020-12-15 ENCOUNTER — Ambulatory Visit
Admission: RE | Admit: 2020-12-15 | Discharge: 2020-12-15 | Disposition: A | Discharge status: Home / Self Care | Payer: Medicare Other | Source: Ambulatory Visit | Attending: Family Medicine | Admitting: Family Medicine

## 2020-12-15 DIAGNOSIS — Z1231 Encounter for screening mammogram for malignant neoplasm of breast: Secondary | ICD-10-CM | POA: Insufficient documentation

## 2020-12-16 ENCOUNTER — Other Ambulatory Visit: Payer: Self-pay | Admitting: Family Medicine

## 2020-12-16 DIAGNOSIS — N6489 Other specified disorders of breast: Secondary | ICD-10-CM

## 2020-12-16 DIAGNOSIS — R928 Other abnormal and inconclusive findings on diagnostic imaging of breast: Secondary | ICD-10-CM

## 2021-01-08 ENCOUNTER — Other Ambulatory Visit: Payer: Self-pay

## 2021-01-08 ENCOUNTER — Ambulatory Visit
Admission: RE | Admit: 2021-01-08 | Discharge: 2021-01-08 | Disposition: A | Discharge status: Home / Self Care | Payer: Medicare Other | Source: Ambulatory Visit | Attending: Family Medicine | Admitting: Family Medicine

## 2021-01-08 DIAGNOSIS — R928 Other abnormal and inconclusive findings on diagnostic imaging of breast: Secondary | ICD-10-CM

## 2021-01-08 DIAGNOSIS — N6489 Other specified disorders of breast: Secondary | ICD-10-CM

## 2021-02-15 ENCOUNTER — Other Ambulatory Visit: Payer: Self-pay | Admitting: Family Medicine

## 2021-02-15 DIAGNOSIS — I10 Essential (primary) hypertension: Secondary | ICD-10-CM

## 2021-04-16 ENCOUNTER — Telehealth: Payer: Self-pay | Admitting: *Deleted

## 2021-04-16 NOTE — Telephone Encounter (Signed)
LMOM for patient to call office to schedule her AWV. Office number provided on voicemail 

## 2021-04-26 ENCOUNTER — Telehealth: Payer: Self-pay

## 2021-04-26 NOTE — Telephone Encounter (Signed)
Called pt and left message to call back concerning colonoscopy vs FIT test

## 2021-05-16 ENCOUNTER — Other Ambulatory Visit: Payer: Self-pay | Admitting: Family Medicine

## 2021-05-16 DIAGNOSIS — E7800 Pure hypercholesterolemia, unspecified: Secondary | ICD-10-CM

## 2021-05-16 DIAGNOSIS — I10 Essential (primary) hypertension: Secondary | ICD-10-CM

## 2021-05-16 NOTE — Telephone Encounter (Signed)
Requested Prescriptions  Pending Prescriptions Disp Refills  . atorvastatin (LIPITOR) 10 MG tablet [Pharmacy Med Name: ATORVASTATIN 10MG  TABLETS] 30 tablet 0    Sig: TAKE 1 TABLET(10 MG) BY MOUTH DAILY     Cardiovascular:  Antilipid - Statins Failed - 05/16/2021  7:17 AM      Failed - LDL in normal range and within 360 days    LDL Chol Calc (NIH)  Date Value Ref Range Status  12/10/2020 120 (H) 0 - 99 mg/dL Final         Passed - Total Cholesterol in normal range and within 360 days    Cholesterol, Total  Date Value Ref Range Status  12/10/2020 196 100 - 199 mg/dL Final         Passed - HDL in normal range and within 360 days    HDL  Date Value Ref Range Status  12/10/2020 60 >39 mg/dL Final         Passed - Triglycerides in normal range and within 360 days    Triglycerides  Date Value Ref Range Status  12/10/2020 87 0 - 149 mg/dL Final         Passed - Patient is not pregnant      Passed - Valid encounter within last 12 months    Recent Outpatient Visits          5 months ago Essential hypertension   [facility] 02/09/2021, MD   11 months ago Essential hypertension   [facility] Duanne Limerick, MD   1 year ago Essential hypertension   [facility] Duanne Limerick, MD   1 year ago Essential hypertension   [facility] Duanne Limerick, MD   2 years ago Essential hypertension   [facility] Duanne Limerick, MD      Future Appointments            In 4 weeks Duanne Limerick, MD [facility]           . lisinopril-hydrochlorothiazide (ZESTORETIC) 10-12.5 MG tablet [Pharmacy Med Name: LISINOPRIL-HCTZ 10/12.5MG  TABLETS] 30 tablet 0    Sig: TAKE 1 TABLET BY MOUTH DAILY     Cardiovascular:  ACEI + Diuretic Combos Failed - 05/16/2021  7:17 AM      Failed - Na in normal range and within 180 days    Sodium  Date Value Ref Range Status  12/10/2020 148 (H) 134 - 144 mmol/L Final  08/15/2011 145 136 -  145 mmol/L Final         Failed - Cr in normal range and within 180 days    Creatinine  Date Value Ref Range Status  08/15/2011 1.00 0.60 - 1.30 mg/dL Final   Creatinine, Ser  Date Value Ref Range Status  12/10/2020 1.12 (H) 0.57 - 1.00 mg/dL Final         Failed - Ca in normal range and within 180 days    Calcium  Date Value Ref Range Status  12/10/2020 10.5 (H) 8.7 - 10.3 mg/dL Final   Calcium, Total  Date Value Ref Range Status  08/15/2011 9.2 8.5 - 10.1 mg/dL Final         Passed - K in normal range and within 180 days    Potassium  Date Value Ref Range Status  12/10/2020 4.4 3.5 - 5.2 mmol/L Final  08/15/2011 3.5 3.5 - 5.1 mmol/L Final         Passed -  Patient is not pregnant      Passed - Last BP in normal range    BP Readings from Last 1 Encounters:  12/10/20 128/80         Passed - Valid encounter within last 6 months    Recent Outpatient Visits          5 months ago Essential hypertension   [facility] Duanne Limerick, MD   11 months ago Essential hypertension   [facility] Duanne Limerick, MD   1 year ago Essential hypertension   [facility] Duanne Limerick, MD   1 year ago Essential hypertension   [facility] Duanne Limerick, MD   2 years ago Essential hypertension   [facility] Duanne Limerick, MD      Future Appointments            In 4 weeks Duanne Limerick, MD [facility]

## 2021-06-14 ENCOUNTER — Other Ambulatory Visit: Payer: Self-pay

## 2021-06-14 ENCOUNTER — Encounter: Payer: Self-pay | Admitting: Family Medicine

## 2021-06-14 ENCOUNTER — Ambulatory Visit (INDEPENDENT_AMBULATORY_CARE_PROVIDER_SITE_OTHER): Payer: Medicare Other | Admitting: Family Medicine

## 2021-06-14 VITALS — BP 134/80 | HR 82 | Resp 16 | Ht 65.5 in | Wt 163.4 lb

## 2021-06-14 DIAGNOSIS — I10 Essential (primary) hypertension: Secondary | ICD-10-CM | POA: Diagnosis not present

## 2021-06-14 DIAGNOSIS — Z23 Encounter for immunization: Secondary | ICD-10-CM | POA: Diagnosis not present

## 2021-06-14 DIAGNOSIS — E7800 Pure hypercholesterolemia, unspecified: Secondary | ICD-10-CM

## 2021-06-14 MED ORDER — LISINOPRIL-HYDROCHLOROTHIAZIDE 10-12.5 MG PO TABS: 1.0000 | ORAL_TABLET | Freq: Every day | ORAL | 1 refills | Status: DC

## 2021-06-14 MED ORDER — ATORVASTATIN CALCIUM 10 MG PO TABS: ORAL_TABLET | ORAL | 1 refills | Status: DC

## 2021-06-14 NOTE — Progress Notes (Signed)
Date:  06/14/2021   Name:  Julie Maldonado   DOB:  November 25, 1955   MRN:  [id number]   Chief Complaint: Hypertension and Hyperlipidemia  Hypertension This is a chronic problem. The current episode started more than 1 year ago. The problem has been gradually improving since onset. The problem is controlled. Pertinent negatives include no anxiety, blurred vision, chest pain, headaches, malaise/fatigue, neck pain, orthopnea, palpitations, peripheral edema, PND, shortness of breath or sweats. There are no associated agents to hypertension. Risk factors for coronary artery disease include dyslipidemia. Past treatments include ACE inhibitors and diuretics. The current treatment provides moderate improvement. There are no compliance problems.  There is no history of angina, kidney disease, CAD/MI, CVA, heart failure, left ventricular hypertrophy, PVD or retinopathy. There is no history of chronic renal disease, a hypertension causing med or renovascular disease.  Hyperlipidemia This is a chronic problem. The current episode started more than 1 year ago. The problem is controlled. Recent lipid tests were reviewed and are normal. She has no history of chronic renal disease, diabetes, hypothyroidism, liver disease, obesity or nephrotic syndrome. Pertinent negatives include no chest pain, myalgias or shortness of breath. The current treatment provides mild improvement of lipids. There are no compliance problems.  Risk factors for coronary artery disease include dyslipidemia.   Lab Results  Component Value Date   CREATININE 1.12 (H) 12/10/2020   BUN 19 12/10/2020   NA 148 (H) 12/10/2020   K 4.4 12/10/2020   CL 105 12/10/2020   CO2 22 12/10/2020   Lab Results  Component Value Date   CHOL 196 12/10/2020   HDL 60 12/10/2020   LDLCALC 120 (H) 12/10/2020   TRIG 87 12/10/2020   CHOLHDL 3.7 05/31/2019   No results found for: TSH No results found for: HGBA1C Lab Results  Component Value Date   WBC 8.7  08/15/2011   HGB 14.3 08/15/2011   HCT 42.6 08/15/2011   MCV 92 08/15/2011   PLT 191 08/15/2011   Lab Results  Component Value Date   ALT 10 12/10/2020   AST 16 12/10/2020   ALKPHOS 78 12/10/2020   BILITOT 0.2 12/10/2020     Review of Systems  Constitutional:  Negative for chills, fever and malaise/fatigue.  HENT:  Negative for drooling, ear discharge, ear pain and sore throat.   Eyes:  Negative for blurred vision.  Respiratory:  Negative for cough, shortness of breath and wheezing.   Cardiovascular:  Negative for chest pain, palpitations, orthopnea, leg swelling and PND.  Gastrointestinal:  Negative for abdominal pain, blood in stool, constipation, diarrhea and nausea.  Endocrine: Negative for polydipsia.  Genitourinary:  Negative for dysuria, frequency, hematuria and urgency.  Musculoskeletal:  Negative for back pain, myalgias and neck pain.  Skin:  Negative for rash.  Allergic/Immunologic: Negative for environmental allergies.  Neurological:  Negative for dizziness and headaches.  Hematological:  Does not bruise/bleed easily.  Psychiatric/Behavioral:  Negative for suicidal ideas. The patient is not nervous/anxious.    Patient Active Problem List   Diagnosis Date Noted   Essential hypertension 04/20/2016   Hyperlipidemia 04/20/2016   Acute maxillary sinusitis 04/20/2016    Allergies  Allergen Reactions   Sulfa Antibiotics Hives    Other reaction(s): Unknown    Past Surgical History:  Procedure Laterality Date   ABDOMINAL HYSTERECTOMY     BACK SURGERY      Social History   Tobacco Use   Smoking status: Former    Types: Cigarettes  Quit date: 11/13/2018    Years since quitting: 2.5   Smokeless tobacco: Never   Tobacco comments:    patient will pick up patches  Vaping Use   Vaping Use: Never used  Substance Use Topics   Alcohol use: No   Drug use: No     Medication list has been reviewed and updated.  Current Meds  Medication Sig   aspirin 81 MG  tablet Take 81 mg by mouth daily.   atorvastatin (LIPITOR) 10 MG tablet TAKE 1 TABLET(10 MG) BY MOUTH DAILY   fluticasone (FLONASE) 50 MCG/ACT nasal spray Place 1 spray into both nostrils daily.   lisinopril-hydrochlorothiazide (ZESTORETIC) 10-12.5 MG tablet TAKE 1 TABLET BY MOUTH DAILY   meclizine (ANTIVERT) 25 MG tablet Take 1 tablet (25 mg total) by mouth 3 (three) times daily as needed for dizziness.   Omega 3 1000 MG CAPS Take 1 capsule (1,000 mg total) by mouth 2 (two) times daily.   [DISCONTINUED] loratadine (CLARITIN) 10 MG tablet Take 1 tablet (10 mg total) by mouth daily. otc    PHQ 2/9 Scores 06/14/2021 12/10/2020 05/22/2020 11/29/2019  PHQ - 2 Score 0 0 0 0  PHQ- 9 Score 0 0 0 0    GAD 7 : Generalized Anxiety Score 06/14/2021 12/10/2020 05/22/2020 11/29/2019  Nervous, Anxious, on Edge 0 0 0 0  Control/stop worrying 0 0 0 0  Worry too much - different things 0 0 0 0  Trouble relaxing 0 0 0 0  Restless 0 0 0 0  Easily annoyed or irritable 0 0 0 0  Afraid - awful might happen 0 0 0 0  Total GAD 7 Score 0 0 0 0  Anxiety Difficulty Not difficult at all - Not difficult at all -    BP Readings from Last 3 Encounters:  06/14/21 134/80  12/10/20 128/80  05/22/20 138/64    Physical Exam Vitals and nursing note reviewed.  Constitutional:      General: She is not in acute distress.    Appearance: She is not diaphoretic.  HENT:     Head: Normocephalic and atraumatic.     Right Ear: Tympanic membrane, ear canal and external ear normal. There is no impacted cerumen.     Left Ear: Tympanic membrane, ear canal and external ear normal. There is no impacted cerumen.     Nose: Nose normal. No congestion or rhinorrhea.     Mouth/Throat:     Pharynx: Oropharynx is clear. No oropharyngeal exudate or posterior oropharyngeal erythema.  Eyes:     General:        Right eye: No discharge.        Left eye: No discharge.     Conjunctiva/sclera: Conjunctivae normal.     Pupils: Pupils are  equal, round, and reactive to light.  Neck:     Thyroid: No thyromegaly.     Vascular: No JVD.  Cardiovascular:     Rate and Rhythm: Normal rate and regular rhythm.     Heart sounds: Normal heart sounds. No murmur heard.   No friction rub. No gallop.  Pulmonary:     Effort: Pulmonary effort is normal.     Breath sounds: Normal breath sounds. No wheezing, rhonchi or rales.  Chest:     Chest wall: No tenderness.  Abdominal:     General: Bowel sounds are normal.     Palpations: Abdomen is soft. There is no mass.     Tenderness: There is no abdominal tenderness. There  is no guarding or rebound.  Musculoskeletal:        General: Normal range of motion.     Cervical back: Normal range of motion and neck supple.  Lymphadenopathy:     Cervical: No cervical adenopathy.  Skin:    General: Skin is warm and dry.  Neurological:     Mental Status: She is alert.     Deep Tendon Reflexes: Reflexes are normal and symmetric.    Wt Readings from Last 3 Encounters:  06/14/21 163 lb 6.4 oz (74.1 kg)  12/10/20 163 lb (73.9 kg)  05/22/20 159 lb (72.1 kg)    BP 134/80   Pulse 82   Resp 16   Ht 5' 5.5" (1.664 m)   Wt 163 lb 6.4 oz (74.1 kg)   SpO2 98%   BMI 26.78 kg/m   Assessment and Plan:  1. Essential hypertension Chronic.  Controlled.  Stable.  Blood pressure 134/80.  Continue lisinopril hydrochlorothiazide 10-12.5 mg once a day.  Will check renal function panel for electrolytes and GFR. - lisinopril-hydrochlorothiazide (ZESTORETIC) 10-12.5 MG tablet; Take 1 tablet by mouth daily.  Dispense: 90 tablet; Refill: 1 - Renal Function Panel  2. Pure hypercholesterolemia Chronic.  Controlled.  Stable.  LDLs in the 120 range and doing well.  We will continue with atorvastatin 10 mg once a day.  And will recheck in 6 months. - atorvastatin (LIPITOR) 10 MG tablet; TAKE 1 TABLET(10 MG) BY MOUTH DAILY  Dispense: 90 tablet; Refill: 1  3. Need for immunization against influenza Discussed and  administered - Flu Vaccine QUAD High Dose(Fluad)

## 2021-06-15 LAB — RENAL FUNCTION PANEL
Albumin: 5.2 g/dL — ABNORMAL HIGH (ref 3.8–4.8)
BUN/Creatinine Ratio: 17 (ref 12–28)
BUN: 19 mg/dL (ref 8–27)
CO2: 24 mmol/L (ref 20–29)
Calcium: 10.1 mg/dL (ref 8.7–10.3)
Chloride: 101 mmol/L (ref 96–106)
Creatinine, Ser: 1.13 mg/dL — ABNORMAL HIGH (ref 0.57–1.00)
Glucose: 100 mg/dL — ABNORMAL HIGH (ref 70–99)
Phosphorus: 2.7 mg/dL — ABNORMAL LOW (ref 3.0–4.3)
Potassium: 4.2 mmol/L (ref 3.5–5.2)
Sodium: 140 mmol/L (ref 134–144)
eGFR: 54 mL/min/{1.73_m2} — ABNORMAL LOW (ref >59)

## 2021-07-16 ENCOUNTER — Telehealth: Payer: Self-pay | Admitting: Family Medicine

## 2021-07-16 NOTE — Telephone Encounter (Signed)
Copied from CRM [id number]. Topic: Medicare AWV >> Jul 16, 2021 10:39 AM Claudette Laws R wrote: Reason for CRM:  Left message for patient to call back and schedule Medicare Annual Wellness Visit (AWV) in office.   If unable to come into the office for AWV,  please offer to do virtually or by telephone.  No hx of AWV eligible for AWVI as of 05/01/2021 per [employer]  Please schedule at anytime with [facility] Health Advisor.      40 Minutes appointment   Any questions, please call me at [phone]

## 2021-12-14 ENCOUNTER — Ambulatory Visit (INDEPENDENT_AMBULATORY_CARE_PROVIDER_SITE_OTHER): Payer: Medicare Other | Admitting: Family Medicine

## 2021-12-14 ENCOUNTER — Encounter: Payer: Self-pay | Admitting: Family Medicine

## 2021-12-14 VITALS — BP 130/80 | HR 76 | Ht 65.5 in | Wt 166.0 lb

## 2021-12-14 DIAGNOSIS — Z1211 Encounter for screening for malignant neoplasm of colon: Secondary | ICD-10-CM

## 2021-12-14 DIAGNOSIS — R42 Dizziness and giddiness: Secondary | ICD-10-CM | POA: Diagnosis not present

## 2021-12-14 DIAGNOSIS — E7800 Pure hypercholesterolemia, unspecified: Secondary | ICD-10-CM | POA: Diagnosis not present

## 2021-12-14 DIAGNOSIS — I10 Essential (primary) hypertension: Secondary | ICD-10-CM

## 2021-12-14 MED ORDER — LISINOPRIL-HYDROCHLOROTHIAZIDE 10-12.5 MG PO TABS: 1.0000 | ORAL_TABLET | Freq: Every day | ORAL | 1 refills | Status: DC

## 2021-12-14 MED ORDER — ATORVASTATIN CALCIUM 10 MG PO TABS: ORAL_TABLET | ORAL | 1 refills | Status: DC

## 2021-12-14 NOTE — Progress Notes (Signed)
? ? ?Date:  12/14/2021  ? ?Name:  Julie Maldonado   DOB:  November 28, 1955   MRN:  [id number] ? ? ?Chief Complaint: Hypertension, Hyperlipidemia, and Dizziness (Takes meclizine as needed) ? ?Hypertension ?This is a chronic problem. The current episode started more than 1 year ago. The problem has been gradually improving since onset. The problem is controlled. Pertinent negatives include no blurred vision, chest pain, headaches, orthopnea, palpitations, peripheral edema, PND or shortness of breath. There are no associated agents to hypertension. Risk factors for coronary artery disease include dyslipidemia. Past treatments include ACE inhibitors and diuretics. The current treatment provides moderate improvement. There are no compliance problems.  There is no history of angina, kidney disease, CAD/MI, CVA, heart failure, left ventricular hypertrophy, PVD or retinopathy. There is no history of chronic renal disease, a hypertension causing med or renovascular disease.  ?Hyperlipidemia ?This is a chronic problem. The problem is controlled. Recent lipid tests were reviewed and are normal. She has no history of chronic renal disease. Pertinent negatives include no chest pain, myalgias or shortness of breath. Current antihyperlipidemic treatment includes statins and diet change. The current treatment provides moderate improvement of lipids. There are no compliance problems.  Risk factors for coronary artery disease include hypertension and dyslipidemia.  ?Dizziness ?This is a chronic problem. The current episode started more than 1 year ago. The problem occurs intermittently. Pertinent negatives include no abdominal pain, arthralgias, chest pain, chills, congestion, coughing, fatigue, fever, headaches, myalgias, nausea, rash, sore throat or weakness.  ? ?Lab Results  ?Component Value Date  ? NA 140 06/14/2021  ? K 4.2 06/14/2021  ? CO2 24 06/14/2021  ? GLUCOSE 100 (H) 06/14/2021  ? BUN 19 06/14/2021  ? CREATININE 1.13 (H)  06/14/2021  ? CALCIUM 10.1 06/14/2021  ? EGFR 54 (L) 06/14/2021  ? GFRNONAA 68 05/22/2020  ? ?Lab Results  ?Component Value Date  ? CHOL 196 12/10/2020  ? HDL 60 12/10/2020  ? LDLCALC 120 (H) 12/10/2020  ? TRIG 87 12/10/2020  ? CHOLHDL 3.7 05/31/2019  ? ?No results found for: TSH ?No results found for: HGBA1C ?Lab Results  ?Component Value Date  ? WBC 8.7 08/15/2011  ? HGB 14.3 08/15/2011  ? HCT 42.6 08/15/2011  ? MCV 92 08/15/2011  ? PLT 191 08/15/2011  ? ?Lab Results  ?Component Value Date  ? ALT 10 12/10/2020  ? AST 16 12/10/2020  ? ALKPHOS 78 12/10/2020  ? BILITOT 0.2 12/10/2020  ? ?No results found for: 25OHVITD2, [place], VD25OH  ? ?Review of Systems  ?Constitutional: Negative.  Negative for chills, fatigue, fever and unexpected weight change.  ?HENT:  Negative for congestion, ear discharge, ear pain, rhinorrhea, sinus pressure, sneezing and sore throat.   ?Eyes:  Negative for blurred vision.  ?Respiratory:  Negative for cough, shortness of breath, wheezing and stridor.   ?Cardiovascular:  Negative for chest pain, palpitations, orthopnea and PND.  ?Gastrointestinal:  Negative for abdominal pain, blood in stool, constipation, diarrhea and nausea.  ?Genitourinary:  Negative for dysuria, flank pain, frequency, hematuria, urgency and vaginal discharge.  ?Musculoskeletal:  Negative for arthralgias, back pain and myalgias.  ?Skin:  Negative for rash.  ?Neurological:  Positive for dizziness. Negative for weakness and headaches.  ?Hematological:  Negative for adenopathy. Does not bruise/bleed easily.  ?Psychiatric/Behavioral:  Negative for dysphoric mood. The patient is not nervous/anxious.   ? ?Patient Active Problem List  ? Diagnosis Date Noted  ? Essential hypertension 04/20/2016  ? Hyperlipidemia 04/20/2016  ? Acute maxillary sinusitis  04/20/2016  ? ? ?Allergies  ?Allergen Reactions  ? Sulfa Antibiotics Hives  ?  Other reaction(s): Unknown  ? ? ?Past Surgical History:  ?Procedure Laterality Date  ? ABDOMINAL  HYSTERECTOMY    ? BACK SURGERY    ? ? ?Social History  ? ?Tobacco Use  ? Smoking status: Former  ?  Types: Cigarettes  ?  Quit date: 11/13/2018  ?  Years since quitting: 3.0  ? Smokeless tobacco: Never  ? Tobacco comments:  ?  patient will pick up patches  ?Vaping Use  ? Vaping Use: Never used  ?Substance Use Topics  ? Alcohol use: No  ? Drug use: No  ? ? ? ?Medication list has been reviewed and updated. ? ?Current Meds  ?Medication Sig  ? aspirin 81 MG tablet Take 81 mg by mouth daily.  ? atorvastatin (LIPITOR) 10 MG tablet TAKE 1 TABLET(10 MG) BY MOUTH DAILY  ? fluticasone (FLONASE) 50 MCG/ACT nasal spray Place 1 spray into both nostrils daily.  ? lisinopril-hydrochlorothiazide (ZESTORETIC) 10-12.5 MG tablet Take 1 tablet by mouth daily.  ? meclizine (ANTIVERT) 25 MG tablet Take 1 tablet (25 mg total) by mouth 3 (three) times daily as needed for dizziness.  ? Omega 3 1000 MG CAPS Take 1 capsule (1,000 mg total) by mouth 2 (two) times daily.  ? ? ? ?  12/14/2021  ?  7:58 AM 06/14/2021  ?  9:51 AM 12/10/2020  ?  9:59 AM 05/22/2020  ?  9:24 AM  ?GAD 7 : Generalized Anxiety Score  ?Nervous, Anxious, on Edge 0 0 0 0  ?Control/stop worrying 0 0 0 0  ?Worry too much - different things 0 0 0 0  ?Trouble relaxing 0 0 0 0  ?Restless 0 0 0 0  ?Easily annoyed or irritable 0 0 0 0  ?Afraid - awful might happen 0 0 0 0  ?Total GAD 7 Score 0 0 0 0  ?Anxiety Difficulty Not difficult at all Not difficult at all  Not difficult at all  ? ? ? ?  12/14/2021  ?  7:58 AM  ?Depression screen PHQ 2/9  ?Decreased Interest 0  ?Down, Depressed, Hopeless 0  ?PHQ - 2 Score 0  ?Altered sleeping 0  ?Tired, decreased energy 0  ?Change in appetite 0  ?Feeling bad or failure about yourself  0  ?Trouble concentrating 0  ?Moving slowly or fidgety/restless 0  ?Suicidal thoughts 0  ?PHQ-9 Score 0  ?Difficult doing work/chores Not difficult at all  ? ? ?BP Readings from Last 3 Encounters:  ?12/14/21 130/80  ?06/14/21 134/80  ?12/10/20 128/80  ? ? ?Physical  Exam ?Vitals and nursing note reviewed. Exam conducted with a chaperone present.  ?Constitutional:   ?   General: She is not in acute distress. ?   Appearance: She is not diaphoretic.  ?HENT:  ?   Head: Normocephalic and atraumatic.  ?   Right Ear: External ear normal.  ?   Left Ear: External ear normal.  ?   Nose: Nose normal.  ?Eyes:  ?   General:     ?   Right eye: No discharge.     ?   Left eye: No discharge.  ?   Conjunctiva/sclera: Conjunctivae normal.  ?   Pupils: Pupils are equal, round, and reactive to light.  ?Neck:  ?   Thyroid: No thyromegaly.  ?   Vascular: No JVD.  ?Cardiovascular:  ?   Rate and Rhythm: Normal rate and regular rhythm.  ?  Heart sounds: Normal heart sounds. No murmur heard. ?  No friction rub. No gallop.  ?Pulmonary:  ?   Effort: Pulmonary effort is normal.  ?   Breath sounds: Normal breath sounds.  ?Abdominal:  ?   General: Bowel sounds are normal.  ?   Palpations: Abdomen is soft. There is no mass.  ?   Tenderness: There is no abdominal tenderness. There is no guarding.  ?Musculoskeletal:     ?   General: Normal range of motion.  ?   Cervical back: Normal range of motion and neck supple.  ?Lymphadenopathy:  ?   Cervical: No cervical adenopathy.  ?Skin: ?   General: Skin is warm and dry.  ?Neurological:  ?   Mental Status: She is alert.  ?   Deep Tendon Reflexes: Reflexes are normal and symmetric.  ? ? ?Wt Readings from Last 3 Encounters:  ?12/14/21 166 lb (75.3 kg)  ?06/14/21 163 lb 6.4 oz (74.1 kg)  ?12/10/20 163 lb (73.9 kg)  ? ? ?BP 130/80   Pulse 76   Ht 5' 5.5" (1.664 m)   Wt 166 lb (75.3 kg)   BMI 27.20 kg/m?  ? ?Assessment and Plan: ? ?1. Essential hypertension ?Chronic.  Controlled.  Stable.  Blood pressure 120/80.  Continue lisinopril hydrochlorothiazide 10-12.5 mg once a day.  Will check CMP for electrolytes and GFR.  We will recheck patient in 6 months. ?- lisinopril-hydrochlorothiazide (ZESTORETIC) 10-12.5 MG tablet; Take 1 tablet by mouth daily.  Dispense: 90 tablet;  Refill: 1 ?- Comprehensive Metabolic Panel (CMET) ? ?2. Pure hypercholesterolemia ?Chronic.  Controlled.  Stable.  Continue atorvastatin 10 mg once a day.  BMI was noted to be just over 27.  Patient has been given d

## 2021-12-14 NOTE — Patient Instructions (Signed)
GUIDELINES FOR  ?LOW-CHOLESTEROL, LOW-TRIGLYCERIDE DIETS  ?  ?FOODS TO USE  ? ?MEATS, FISH Choose lean meats (chicken, turkey, veal, and non-fatty cuts of beef with excess fat trimmed; one serving = 3 oz of cooked meat). Also, fresh or frozen fish, canned fish packed in water, and shellfish (lobster, crabs, shrimp, and oysters). Limit use to no more than one serving of one of these per week. Shellfish are high in cholesterol but low in saturated fat and should be used sparingly. Meats and fish should be broiled (pan or oven) or baked on a rack.  ?EGGS Egg substitutes and egg whites (use freely). Egg yolks (limit two per week).  ?FRUITS Eat three servings of fresh fruit per day (1 serving = ? cup). Be sure to have at least one citrus fruit daily. Frozen and canned fruit with no sugar or syrup added may be used.  ?VEGETABLES Most vegetables are not limited (see next page). One dark-green (string beans, escarole) or one deep yellow (squash) vegetable is recommended daily. Cauliflower, broccoli, and celery, as well as potato skins, are recommended for their fiber content. (Fiber is associated with cholesterol reduction) It is preferable to steam vegetables, but they may be boiled, strained, or braised with polyunsaturated vegetable oil (see below).  ?BEANS Dried peas or beans (1 serving = ? cup) may be used as a bread substitute.  ?NUTS Almonds, walnuts, and peanuts may be used sparingly  ?(1 serving = 1 Tablespoonful). Use pumpkin, sesame, or sunflower seeds.  ?BREADS, GRAINS One roll or one slice of whole grain or enriched bread may be used, or three soda crackers or four pieces of melba toast as a substitute. Spaghetti, rice or noodles (? cup) or ? large ear of corn may be used as a bread substitute. In preparing these foods do not use butter or shortening, use soft margarine. Also use egg and sugar substitutes.  Choose high fiber grains, such as oats and whole wheat.  ?CEREALS Use ? cup of hot cereal or ? cup of  cold cereal per day. Add a sugar substitute if desired, with 99% fat free or skim milk.  ?MILK PRODUCTS Always use 99% fat free or skim milk, dairy products such as low fat cheeses (farmer's uncreamed diet cottage), low-fat yogurt, and powdered skim milk.  ?FATS, OILS Use soft (not stick) margarine; vegetable oils that are high in polyunsaturated fats (such as safflower, sunflower, soybean, corn, and cottonseed). Always refrigerate meat drippings to harden the fat and remove it before preparing gravies  ?DESSERTS, SNACKS Limit to two servings per day; substitute each serving for a bread/cereal serving: ice milk, water sherbet (1/4 cup); unflavored gelatin or gelatin flavored with sugar substitute (1/3 cup); pudding prepared with skim milk (1/2 cup); egg white souffl?s; unbuttered popcorn (1 ? cups). Substitute carob for chocolate.  ?BEVERAGES Fresh fruit juices (limit 4 oz per day); black coffee, plain or herbal teas; soft drinks with sugar substitutes; club soda, preferably salt-free; cocoa made with skim milk or nonfat dried milk and water (sugar substitute added if desired); clear broth. Alcohol: limit two servings per day (see second page).  ?MISCELLANEOUS ? You may use the following freely: vinegar, spices, herbs, nonfat bouillon, mustard, Worcestershire sauce, soy sauce, flavoring essence.  ? ? ? ? ? ? ? ? ? ? ? ? ? ? ? ? ?GUIDELINES FOR  ?LOW-CHOLESTEROL, LOW TRIGLYCERIDE DIETS  ?  ?FOODS TO AVOID  ? ?MEATS, FISH Marbled beef, pork, bacon, sausage, and other pork products; fatty   fowl (duck, goose); skin and fat of turkey and chicken; processed meats; luncheon meats (salami, bologna); frankfurters and fast-food hamburgers (theyre loaded with fat); organ meats (kidneys, liver); canned fish packed in oil.  ?EGGS Limit egg yolks to two per week.   ?FRUITS Coconuts (rich in saturated fats).  ?VEGETABLES Avoid avocados. Starchy vegetables (potatoes, corn, lima beans, dried peas, beans) may be used only if  substitutes for a serving of bread or cereal. (Baked potato skin, however, is desirable for its fiber content.  ?BEANS Commercial baked beans with sugar and/or pork added.  ?NUTS Avoid nuts.  Limit peanuts and walnuts to one tablespoonful per day.  ?BREADS, GRAINS Any baked goods with shortening and/or sugar. Commercial mixes with dried eggs and whole milk. Avoid sweet rolls, doughnuts, breakfast pastries (Danish), and sweetened packaged cereals (the added sugar converts readily to triglycerides).  ?MILK PRODUCTS Whole milk and whole-milk packaged goods; cream; ice cream; whole-milk puddings, yogurt, or cheeses; nondairy cream substitutes.  ?FATS, OILS Butter, lard, animal fats, bacon drippings, gravies, cream sauces as well as palm and coconut oils. All these are high in saturated fats. Examine labels on cholesterol free products for hydrogenated fats. (These are oils that have been hardened into solids and in the process have become saturated.)  ?DESSERTS, SNACKS Fried snack foods like potato chips; chocolate; candies in general; jams, jellies, syrups; whole- milk puddings; ice cream and milk sherbets; hydrogenated peanut butter.  ?BEVERAGES Sugared fruit juices and soft drinks; cocoa made with whole milk and/or sugar. When using alcohol (1 oz liquor, 5 oz beer, or 2 ? oz dry table wine per serving), one serving must be substituted for one bread or cereal serving (limit, two servings of alcohol per day).  ? SPECIAL NOTES  ?  Remember that even non-limited foods should be used in moderation. ?While on a cholesterol-lowering diet, be sure to avoid animal fats and marbled meats. ?3. While on a triglyceride-lowering diet, be sure to avoid sweets and to control the amount of carbohydrates you eat (starchy foods such as flour, bread, potatoes).While on a tri-glyceride-lowering diet, be sure to avoid sweets ?Buy a good low-fat cookbook, such as the one published by the American Heart Association. ?Consult your physician  if you have any questions.  ? ? ? ? ? ? ? ? ? ? ? ? ? ?[facility] Low Glycemic Diet Plan ? ? ?Low Glycemic Foods (20-49) Moderate Glycemic Foods (50-69) High Glycemic Foods (70-100)  ?    ?Breakfast Creals Breakfast Cereals Breakfast Cereals  ?All Bran All-Bran Fruit'n Oats  ? Bran Buds Bran Chex  ? Cheerios Corn chex  ?  ?Fiber One Oatmeal (not instant)  ? Just Right Mini-Wheats  ? Corn Flakes Cream of Wheat  ?  ?Oat Bran Special K Swiss Muesli  ? Grape Nuts Grape Nut Flakes  ?  ?  Grits Nutri-Grain  ?  ?Fruits and fruit juice: Fruits Puffed Rice Puffed Wheat  ?  ?(Limit to 1-2 Servings per day) Banana (under-ride) Dates  ? Rice Chex Rice Krispies  ?  ?Apples Apricots (fresh/dried)  ? Figs Grapes  ? Shredded Wheat Team  ?  ?Blackberries Blueberries  ? Kiwi Mango  ? Total   ?  ?Cherries Cranberries  ? Oranges Raisins  ?   ?Peaches Pears  ?  Fruits  ?Plums Prunes  ? Fruit Juices Pineapple Watermelon  ?  ?Grapefruit Raspberries  ? Cranberry Juice Orange Juice  ? Banana (over-ripe)   ?  ?Strawberries Tangerines  ?    ?  Apple Juice Grapefruit Juice  ? Beans and Legumes Beverages  ?Tomato Juice   ? [place]-type baked beans Sodas, sweet tea, pineapple juice  ? Canned pinto, kidney, or navy beans   ?Beans and Legumes (fresh-cooked) Green peas Vegetables  ?Black-eyed peas Butter Beans  ?  Potato, baked, boiled, fried, mashed  ?Chick peas Lentils  ? Vegetables French fries  ?Green beans Lima beans  ? Beets Carrots  ? Canned or frozen corn  ?Kidney beans Navy beans  ? Sweet potato Yam  ? Parsnips  ?Pinto beans Snow peas  ? Corn on the cob Winter squash  ?    ?Non-starchy vegetables Grains Breads  ?Asparagus, avocado, broccoli, cabbage Cornmeal Rice, brown  ? Most breads (white and whole grain)  ?cauliflower, celery, cucumber, greens Rice, white Couscous  ? Bagels Bread sticks  ?  ?lettuce, mushrooms, peppers, tomatoes  Bread stuffing Kaiser roll  ?  ?okra, onions, spinach, summer squash Pasta Dinner rolls  ? Macaroni  Pizza, cheese  ?   ?Grains Ravioli, meat filled Spaghetti, white  ? Grains  ?Barley Bulgur  ?  Rice, instant Tapioca, with milk  ?  ?Rye Wild rice  ? Nuts   ? Cashews Macadamia  ? Candy and most cookies  ?Nuts and o

## 2021-12-15 LAB — COMPREHENSIVE METABOLIC PANEL
ALT: 16 IU/L (ref 0–32)
AST: 13 IU/L (ref 0–40)
Albumin/Globulin Ratio: 1.9 (ref 1.2–2.2)
Albumin: 4.7 g/dL (ref 3.8–4.8)
Alkaline Phosphatase: 92 IU/L (ref 44–121)
BUN/Creatinine Ratio: 16 (ref 12–28)
BUN: 19 mg/dL (ref 8–27)
Bilirubin Total: 0.3 mg/dL (ref 0.0–1.2)
CO2: 22 mmol/L (ref 20–29)
Calcium: 10 mg/dL (ref 8.7–10.3)
Chloride: 100 mmol/L (ref 96–106)
Creatinine, Ser: 1.18 mg/dL — ABNORMAL HIGH (ref 0.57–1.00)
Globulin, Total: 2.5 g/dL (ref 1.5–4.5)
Glucose: 109 mg/dL — ABNORMAL HIGH (ref 70–99)
Potassium: 4.1 mmol/L (ref 3.5–5.2)
Sodium: 140 mmol/L (ref 134–144)
Total Protein: 7.2 g/dL (ref 6.0–8.5)
eGFR: 51 mL/min/{1.73_m2} — ABNORMAL LOW (ref >59)

## 2021-12-15 LAB — LIPID PANEL WITH LDL/HDL RATIO
Cholesterol, Total: 203 mg/dL — ABNORMAL HIGH (ref 100–199)
HDL: 61 mg/dL (ref >39)
LDL Chol Calc (NIH): 128 mg/dL — ABNORMAL HIGH (ref 0–99)
LDL/HDL Ratio: 2.1 ratio (ref 0.0–3.2)
Triglycerides: 76 mg/dL (ref 0–149)
VLDL Cholesterol Cal: 14 mg/dL (ref 5–40)

## 2022-01-04 ENCOUNTER — Other Ambulatory Visit: Payer: Self-pay

## 2022-01-04 ENCOUNTER — Telehealth: Payer: Self-pay

## 2022-01-04 ENCOUNTER — Telehealth: Payer: Self-pay | Admitting: Family Medicine

## 2022-01-04 DIAGNOSIS — Z1211 Encounter for screening for malignant neoplasm of colon: Secondary | ICD-10-CM

## 2022-01-04 NOTE — Progress Notes (Signed)
Placed ref to colonoscopy

## 2022-01-04 NOTE — Telephone Encounter (Signed)
Spoke to pt- she would like to proceed with colonoscopy. I placed ref to Dr Servando Snare and asked that she call in 1-2 weeks if she hasn't heard from the appt

## 2022-01-04 NOTE — Telephone Encounter (Signed)
CALLED PATIENT NO ANSWER LEFT VOICEMAIL FOR A CALL BACK ? ?

## 2022-01-04 NOTE — Telephone Encounter (Signed)
Copied from CRM [id number]. Topic: General - Other >> Jan 04, 2022 11:25 AM Gaetana Michaelis A wrote: Reason for CRM: The patient would like to speak with Delice Bison when possible to discuss scheduling a colonoscopy   Please contact further when possible

## 2022-01-05 ENCOUNTER — Other Ambulatory Visit: Payer: Self-pay

## 2022-01-05 DIAGNOSIS — Z1211 Encounter for screening for malignant neoplasm of colon: Secondary | ICD-10-CM

## 2022-01-05 MED ORDER — NA SULFATE-K SULFATE-MG SULF 17.5-3.13-1.6 GM/177ML PO SOLN: 1.0000 | Freq: Once | ORAL | 0 refills | Status: AC

## 2022-01-05 MED ORDER — ONDANSETRON HCL 4 MG PO TABS: 4.0000 mg | ORAL_TABLET | Freq: Three times a day (TID) | ORAL | 1 refills | Status: DC | PRN

## 2022-01-05 NOTE — Progress Notes (Signed)
Gastroenterology Pre-Procedure Review  Request Date: 01/31/2022 Requesting Physician: Dr. Servando Snare  PATIENT REVIEW QUESTIONS: The patient responded to the following health history questions as indicated:    1. Are you having any GI issues? no 2. Do you have a personal history of Polyps? no 3. Do you have a family history of Colon Cancer or Polyps? no 4. Diabetes Mellitus? no 5. Joint replacements in the past 12 months?no 6. Major health problems in the past 3 months?no 7. Any artificial heart valves, MVP, or defibrillator?no    MEDICATIONS & ALLERGIES:    Patient reports the following regarding taking any anticoagulation/antiplatelet therapy:   Plavix, Coumadin, Eliquis, Xarelto, Lovenox, Pradaxa, Brilinta, or Effient? no Aspirin? yes (81 mg)  Patient confirms/reports the following medications:  Current Outpatient Medications  Medication Sig Dispense Refill   aspirin 81 MG tablet Take 81 mg by mouth daily.     atorvastatin (LIPITOR) 10 MG tablet TAKE 1 TABLET(10 MG) BY MOUTH DAILY 90 tablet 1   fluticasone (FLONASE) 50 MCG/ACT nasal spray Place 1 spray into both nostrils daily. 16 g 11   lisinopril-hydrochlorothiazide (ZESTORETIC) 10-12.5 MG tablet Take 1 tablet by mouth daily. 90 tablet 1   meclizine (ANTIVERT) 25 MG tablet Take 1 tablet (25 mg total) by mouth 3 (three) times daily as needed for dizziness. 30 tablet 0   Omega 3 1000 MG CAPS Take 1 capsule (1,000 mg total) by mouth 2 (two) times daily.     No current facility-administered medications for this visit.    Patient confirms/reports the following allergies:  Allergies  Allergen Reactions   Sulfa Antibiotics Hives    Other reaction(s): Unknown    No orders of the defined types were placed in this encounter.   AUTHORIZATION INFORMATION Primary Insurance: 1D#: Group #:  Secondary Insurance: 1D#: Group #:  SCHEDULE INFORMATION: Date: 01/31/2022 Time: Location:[facility]

## 2022-01-31 ENCOUNTER — Ambulatory Visit: Admission: RE | Admit: 2022-01-31 | Payer: Medicare Other | Source: Ambulatory Visit | Admitting: Gastroenterology

## 2022-01-31 ENCOUNTER — Encounter: Admission: RE | Payer: Self-pay | Source: Ambulatory Visit

## 2022-01-31 SURGERY — COLONOSCOPY WITH PROPOFOL
Anesthesia: Choice

## 2022-02-05 ENCOUNTER — Ambulatory Visit
Admission: EM | Admit: 2022-02-05 | Discharge: 2022-02-05 | Disposition: A | Discharge status: Home / Self Care | Payer: Medicare Other | Attending: Physician Assistant | Admitting: Physician Assistant

## 2022-02-05 ENCOUNTER — Encounter: Payer: Self-pay | Admitting: Emergency Medicine

## 2022-02-05 DIAGNOSIS — M545 Low back pain, unspecified: Secondary | ICD-10-CM

## 2022-02-05 MED ORDER — METHYLPREDNISOLONE 4 MG PO TBPK
ORAL_TABLET | ORAL | 0 refills | Status: DC
Start: 2022-02-05 — End: 2022-02-14

## 2022-02-05 MED ORDER — BACLOFEN 10 MG PO TABS: 10.0000 mg | ORAL_TABLET | Freq: Three times a day (TID) | ORAL | 0 refills | Status: DC | PRN

## 2022-02-05 NOTE — Discharge Instructions (Signed)

## 2022-02-05 NOTE — ED Triage Notes (Signed)
Patient c/o left sided lower back pain that started on Thursday.  Patient states that when she turns to the right at her wait she can feel a pull sensation on the left side.  Patient denies injury or fall.  Patient denies any urinary symptoms.

## 2022-02-05 NOTE — ED Provider Notes (Signed)
[facility]    CSN: [id number] Arrival date & time: 02/05/22  0803      History   Chief Complaint Chief Complaint  Patient presents with   Back Pain    HPI Julie Maldonado is a 66 y.o. female presenting for left-sided lower back pain x2 days.  Patient reports pain started after she mowed her lawn and vacuumed.  She reports increased pain when she rotates to the left side.  Increased pain when she extends her back but states she is able to touch her toes fine but has a little bit of discomfort when coming back up.  Pain does radiate a little bit into her left buttocks but not down her leg and she has no associated numbness, weakness or tingling.  Reports history of back surgery in 1995 when she had a couple of protruding disks.  States she has not had any back issues since then.  She has taken 500 mg of Aleve and use ice and heat and says it has not helped.  Denies any associated urinary symptoms and no other complaints.  HPI  Past Medical History:  Diagnosis Date   Hyperlipemia    Hypertension    Insomnia     Patient Active Problem List   Diagnosis Date Noted   Essential hypertension 04/20/2016   Hyperlipidemia 04/20/2016   Acute maxillary sinusitis 04/20/2016    Past Surgical History:  Procedure Laterality Date   ABDOMINAL HYSTERECTOMY     BACK SURGERY      OB History   No obstetric history on file.      Home Medications    Prior to Admission medications   Medication Sig Start Date End Date Taking? Authorizing Provider  aspirin 81 MG tablet Take 81 mg by mouth daily.   Yes [provider]  atorvastatin (LIPITOR) 10 MG tablet TAKE 1 TABLET(10 MG) BY MOUTH DAILY 12/14/21  Yes Duanne Limerick, MD  baclofen (LIORESAL) 10 MG tablet Take 1 tablet (10 mg total) by mouth 3 (three) times daily as needed for muscle spasms. 02/05/22  Yes Shirlee Latch, PA-C  lisinopril-hydrochlorothiazide (ZESTORETIC) 10-12.5 MG tablet Take 1 tablet by mouth daily.  12/14/21  Yes Duanne Limerick, MD  methylPREDNISolone (MEDROL DOSEPAK) 4 MG TBPK tablet Take p.o. according to Dosepak instructions 02/05/22  Yes Eusebio Friendly B, PA-C  Omega 3 1000 MG CAPS Take 1 capsule (1,000 mg total) by mouth 2 (two) times daily. 05/22/20  Yes Duanne Limerick, MD  fluticasone (FLONASE) 50 MCG/ACT nasal spray Place 1 spray into both nostrils daily. 05/22/20   Duanne Limerick, MD  meclizine (ANTIVERT) 25 MG tablet Take 1 tablet (25 mg total) by mouth 3 (three) times daily as needed for dizziness. 03/23/20   Lamptey, Britta Mccreedy, MD  ondansetron (ZOFRAN) 4 MG tablet Take 1 tablet (4 mg total) by mouth every 8 (eight) hours as needed for nausea or vomiting. 01/05/22   Midge Minium, MD    Family History Family History  Problem Relation Age of Onset   Cancer Mother    Breast cancer Mother 21   Diabetes Maternal Grandfather    Stroke Maternal Grandfather     Social History Social History   Tobacco Use   Smoking status: Former    Types: Cigarettes    Quit date: 11/13/2018    Years since quitting: 3.2   Smokeless tobacco: Never   Tobacco comments:    patient will pick up patches  Vaping Use  Vaping Use: Never used  Substance Use Topics   Alcohol use: No   Drug use: No     Allergies   Sulfa antibiotics   Review of Systems Review of Systems  Constitutional:  Negative for fatigue and fever.  Genitourinary:  Negative for difficulty urinating, dysuria, flank pain and hematuria.  Musculoskeletal:  Positive for back pain. Negative for gait problem.  Skin:  Negative for rash.  Neurological:  Negative for weakness and numbness.     Physical Exam Triage Vital Signs ED Triage Vitals  Enc Vitals Group     BP --      Pulse --      Resp --      Temp --      Temp src --      SpO2 --      Weight 02/05/22 0813 166 lb 0.1 oz (75.3 kg)     Height 02/05/22 0813 5' 5.5" (1.664 m)     Head Circumference --      Peak Flow --      Pain Score 02/05/22 0812 8     Pain Loc  --      Pain Edu? --      Excl. in GC? --    No data found.  Updated Vital Signs BP (!) 145/75 (BP Location: Left Arm)   Pulse 86   Temp 98 F (36.7 C) (Oral)   Resp 14   Ht 5' 5.5" (1.664 m)   Wt 166 lb 0.1 oz (75.3 kg)   SpO2 99%   BMI 27.20 kg/m     Physical Exam Vitals and nursing note reviewed.  Constitutional:      General: She is not in acute distress.    Appearance: Normal appearance. She is not ill-appearing or toxic-appearing.  HENT:     Head: Normocephalic and atraumatic.  Eyes:     General: No scleral icterus.       Right eye: No discharge.        Left eye: No discharge.     Conjunctiva/sclera: Conjunctivae normal.  Cardiovascular:     Rate and Rhythm: Normal rate and regular rhythm.     Heart sounds: Normal heart sounds.  Pulmonary:     Effort: Pulmonary effort is normal. No respiratory distress.     Breath sounds: Normal breath sounds.  Musculoskeletal:     Cervical back: Neck supple.     Lumbar back: Tenderness (mild TTP of left para lumbar muscles) present. No bony tenderness. Normal range of motion. Negative right straight leg raise test and negative left straight leg raise test.       Back:     Comments: 5 out of 5 strength bilateral lower extremities.  Skin:    General: Skin is dry.  Neurological:     General: No focal deficit present.     Mental Status: She is alert. Mental status is at baseline.     Motor: No weakness.     Gait: Gait normal.  Psychiatric:        Mood and Affect: Mood normal.        Behavior: Behavior normal.        Thought Content: Thought content normal.      UC Treatments / Results  Labs (all labs ordered are listed, but only abnormal results are displayed) Labs Reviewed - No data to display  EKG   Radiology No results found.  Procedures Procedures (including critical care time)  Medications Ordered in UC  Medications - No data to display  Initial Impression / Assessment and Plan / UC Course  I have  reviewed the triage vital signs and the nursing notes.  Pertinent labs & imaging results that were available during my care of the patient were reviewed by me and considered in my medical decision making (see chart for details).  66 year old female presenting for left-sided lower back pain for the past couple of days.  Denies injury but symptoms that started after she was more physically active outside mowing her lawn and inside vacuuming.  No numbness, tingling or weakness.  On exam she does have mild tenderness palpation of the left paravertebral muscles but no spinal tenderness.  Negative straight leg raise bilaterally and 5-5 strength bilateral lower extremities.  Suspect possible lumbar strain but could be bulging disc if pain continues to radiate further than buttocks.  I discussed with patient.  At this time we will treat with Medrol Dosepak since the Aleve has not helped.  Also advised she may take Tylenol.  Sent baclofen.  Advised continue with ice and heat.  Reviewed return and ER precautions for back pain.   Final Clinical Impressions(s) / UC Diagnoses   Final diagnoses:  Acute left-sided low back pain without sciatica     Discharge Instructions      BACK PAIN: Stressed avoiding painful activities . RICE (REST, ICE, COMPRESSION, ELEVATION) guidelines reviewed. May alternate ice and heat. Consider use of muscle rubs, Salonpas patches, etc. Use medications as directed including muscle relaxers if prescribed. Take anti-inflammatory medications as prescribed or OTC NSAIDs/Tylenol.  F/u with PCP in 7-10 days for reexamination, and please feel free to call or return to the urgent care at any time for any questions or concerns you may have and we will be happy to help you!   BACK PAIN RED FLAGS: If the back pain acutely worsens or there are any red flag symptoms such as numbness/tingling, leg weakness, saddle anesthesia, or loss of bowel/bladder control, go immediately to the ER. Follow up  with [place] as scheduled or sooner if the pain does not begin to resolve or if it worsens before the follow up       ED Prescriptions     Medication Sig Dispense Auth. Provider   methylPREDNISolone (MEDROL DOSEPAK) 4 MG TBPK tablet Take p.o. according to Dosepak instructions 21 tablet Eusebio Friendly B, PA-C   baclofen (LIORESAL) 10 MG tablet Take 1 tablet (10 mg total) by mouth 3 (three) times daily as needed for muscle spasms. 30 each Shirlee Latch, PA-C      I have reviewed the PDMP during this encounter.   Shirlee Latch, PA-C 02/05/22 [phone]

## 2022-02-14 ENCOUNTER — Ambulatory Visit (INDEPENDENT_AMBULATORY_CARE_PROVIDER_SITE_OTHER): Payer: Medicare Other

## 2022-02-14 DIAGNOSIS — Z0000 Encounter for general adult medical examination without abnormal findings: Secondary | ICD-10-CM

## 2022-02-14 NOTE — Patient Instructions (Signed)
Health Maintenance, Female Adopting a healthy lifestyle and getting preventive care are important in promoting health and wellness. Ask your health care provider about: The right schedule for you to have regular tests and exams. Things you can do on your own to prevent diseases and keep yourself healthy. What should I know about diet, weight, and exercise? Eat a healthy diet  Eat a diet that includes plenty of vegetables, fruits, low-fat dairy products, and lean protein. Do not eat a lot of foods that are high in solid fats, added sugars, or sodium. Maintain a healthy weight Body mass index (BMI) is used to identify weight problems. It estimates body fat based on height and weight. Your health care provider can help determine your BMI and help you achieve or maintain a healthy weight. Get regular exercise Get regular exercise. This is one of the most important things you can do for your health. Most adults should: Exercise for at least 150 minutes each week. The exercise should increase your heart rate and make you sweat (moderate-intensity exercise). Do strengthening exercises at least twice a week. This is in addition to the moderate-intensity exercise. Spend less time sitting. Even light physical activity can be beneficial. Watch cholesterol and blood lipids Have your blood tested for lipids and cholesterol at 67 years of age, then have this test every 5 years. Have your cholesterol levels checked more often if: Your lipid or cholesterol levels are high. You are older than 67 years of age. You are at high risk for heart disease. What should I know about cancer screening? Depending on your health history and family history, you may need to have cancer screening at various ages. This may include screening for: Breast cancer. Cervical cancer. Colorectal cancer. Skin cancer. Lung cancer. What should I know about heart disease, diabetes, and high blood pressure? Blood pressure and heart  disease High blood pressure causes heart disease and increases the risk of stroke. This is more likely to develop in people who have high blood pressure readings or are overweight. Have your blood pressure checked: Every 3-5 years if you are 18-39 years of age. Every year if you are 40 years old or older. Diabetes Have regular diabetes screenings. This checks your fasting blood sugar level. Have the screening done: Once every three years after age 40 if you are at a normal weight and have a low risk for diabetes. More often and at a younger age if you are overweight or have a high risk for diabetes. What should I know about preventing infection? Hepatitis B If you have a higher risk for hepatitis B, you should be screened for this virus. Talk with your health care provider to find out if you are at risk for hepatitis B infection. Hepatitis C Testing is recommended for: Everyone born from 1945 through 1965. Anyone with known risk factors for hepatitis C. Sexually transmitted infections (STIs) Get screened for STIs, including gonorrhea and chlamydia, if: You are sexually active and are younger than 67 years of age. You are older than 67 years of age and your health care provider tells you that you are at risk for this type of infection. Your sexual activity has changed since you were last screened, and you are at increased risk for chlamydia or gonorrhea. Ask your health care provider if you are at risk. Ask your health care provider about whether you are at high risk for HIV. Your health care provider may recommend a prescription medicine to help prevent HIV   infection. If you choose to take medicine to prevent HIV, you should first get tested for HIV. You should then be tested every 3 months for as long as you are taking the medicine. Pregnancy If you are about to stop having your period (premenopausal) and you may become pregnant, seek counseling before you get pregnant. Take 400 to 800  micrograms (mcg) of folic acid every day if you become pregnant. Ask for birth control (contraception) if you want to prevent pregnancy. Osteoporosis and menopause Osteoporosis is a disease in which the bones lose minerals and strength with aging. This can result in bone fractures. If you are 65 years old or older, or if you are at risk for osteoporosis and fractures, ask your health care provider if you should: Be screened for bone loss. Take a calcium or vitamin D supplement to lower your risk of fractures. Be given hormone replacement therapy (HRT) to treat symptoms of menopause. Follow these instructions at home: Alcohol use Do not drink alcohol if: Your health care provider tells you not to drink. You are pregnant, may be pregnant, or are planning to become pregnant. If you drink alcohol: Limit how much you have to: 0-1 drink a day. Know how much alcohol is in your drink. In the [place] one drink equals one 12 oz bottle of beer (355 mL), one 5 oz glass of wine (148 mL), or one 1 oz glass of hard liquor (44 mL). Lifestyle Do not use any products that contain nicotine or tobacco. These products include cigarettes, chewing tobacco, and vaping devices, such as e-cigarettes. If you need help quitting, ask your health care provider. Do not use street drugs. Do not share needles. Ask your health care provider for help if you need support or information about quitting drugs. General instructions Schedule regular health, dental, and eye exams. Stay current with your vaccines. Tell your health care provider if: You often feel depressed. You have ever been abused or do not feel safe at home. Summary Adopting a healthy lifestyle and getting preventive care are important in promoting health and wellness. Follow your health care provider's instructions about healthy diet, exercising, and getting tested or screened for diseases. Follow your health care provider's instructions on monitoring your  cholesterol and blood pressure. This information is not intended to replace advice given to you by your health care provider. Make sure you discuss any questions you have with your health care provider. Document Revised: 12/07/2020 Document Reviewed: 12/07/2020 Elsevier Patient Education  2023 Elsevier Inc.  Mammogram A mammogram is an X-ray of the breasts. This procedure can screen for and detect any changes that may indicate breast cancer. Mammograms are regularly done beginning at age 40 for women with average risk. A man may have a mammogram if he has a lump or swelling in his breast tissue. A mammogram can also identify other changes and variations in the breast, such as: Inflammation of the breast tissue (mastitis). An infected area that contains a collection of pus (abscess). A fluid-filled sac (cyst). Tumors that are not cancerous (benign). Fibrocystic changes. This is when breast tissue becomes denser and can make the tissue feel rope-like or uneven under the skin. Women at higher risk for breast cancer need earlier and more comprehensive screening for abnormal changes. Breast tomosynthesis, or three-dimensional (3D) mammography, and digital breast tomosynthesis are advanced forms of imaging that create 3D pictures of the breasts. Tell a health care provider: About any allergies you have. If you have breast implants. If   you have had previous breast disease, biopsy, or surgery. If you have a family history of breast cancer. If you are breastfeeding. Whether you are pregnant or may be pregnant. What are the risks? Generally, this is a safe procedure. However, problems may occur, including: Exposure to radiation. Radiation levels are very low with this test. The need for more tests. The mammogram fails to detect certain cancers or the results are misinterpreted. Difficulty with detecting breast cancer in women with dense breasts. What happens before the procedure? Schedule your  test about 1-2 weeks after your menstrual period if you are menstruating. This is usually when your breasts are the least tender. If you have had a mammogram done at a different facility in the past, get the mammogram X-rays or have them sent to your current exam facility. The new and old images will be compared. Wash your breasts and underarms on the day of the test. Do not wear deodorants, perfumes, lotions, or powders anywhere on your body on the day of the test. Remove any jewelry from your neck. Wear clothes that you can change into and out of easily. What happens during the procedure?  You will undress from the waist up and put on a gown that opens in the front. You will stand in front of the X-ray machine. Each breast will be placed between two plastic or glass plates. The plates will compress your breast for a few seconds. Try to stay as relaxed as possible. This procedure does not cause any harm to your breasts. Any discomfort you feel will be very brief. X-rays will be taken from different angles of each breast. The procedure may vary among health care providers and hospitals. What can I expect after the procedure? The mammogram will be examined by a specialist (radiologist). You may need to repeat certain parts of the test, depending on the quality of the images. This is done if the radiologist needs a better view of the breast tissue. You may resume your normal activities. It is up to you to get the results of your procedure. Ask your health care provider, or the department that is doing the procedure, when your results will be ready. Summary A mammogram is an X-ray of the breasts. This procedure can screen for and detect any changes that may indicate breast cancer. A man may have a mammogram if he has a lump or swelling in his breast tissue. If you have had a mammogram done at a different facility in the past, get the mammogram X-rays or have them sent to your current exam facility  in order to compare them. Schedule your test about 1-2 weeks after your menstrual period if you are menstruating. Ask when your test results will be ready. Make sure you get your test results. This information is not intended to replace advice given to you by your health care provider. Make sure you discuss any questions you have with your health care provider. Document Revised: 03/31/2021 Document Reviewed: 05/18/2020 Elsevier Patient Education  2023 Elsevier Inc.  

## 2022-02-14 NOTE — Progress Notes (Signed)
I connected with  Julie Maldonado on 02/14/22 by a audio enabled telemedicine application and verified that I am speaking with the correct person using two identifiers.  Patient Location: Home  Provider Location: Office/Clinic  I discussed the limitations of evaluation and management by telemedicine. The patient expressed understanding and agreed to proceed.   Subjective:   Julie Maldonado is a 66 y.o. female who presents for Medicare Annual (Subsequent) preventive examination.  Review of Systems    Per HPI unless specifically indicated below      Objective:    Today's Vitals   02/14/22 1304  PainSc: 0-No pain   There is no height or weight on file to calculate BMI.     02/05/2022    8:15 AM 03/23/2020    6:05 PM 05/27/2017    9:25 AM 12/02/2016    3:39 PM  Advanced Directives  Does Patient Have a Medical Advance Directive? Yes No No Yes  Type of Estate agent of [place];Living will   Living will    Current Medications (verified) Outpatient Encounter Medications as of 02/14/2022  Medication Sig   aspirin 81 MG tablet Take 81 mg by mouth daily.   atorvastatin (LIPITOR) 10 MG tablet TAKE 1 TABLET(10 MG) BY MOUTH DAILY   fluticasone (FLONASE) 50 MCG/ACT nasal spray Place 1 spray into both nostrils daily.   lisinopril-hydrochlorothiazide (ZESTORETIC) 10-12.5 MG tablet Take 1 tablet by mouth daily.   meclizine (ANTIVERT) 25 MG tablet Take 1 tablet (25 mg total) by mouth 3 (three) times daily as needed for dizziness.   Omega 3 1000 MG CAPS Take 1 capsule (1,000 mg total) by mouth 2 (two) times daily.   [DISCONTINUED] baclofen (LIORESAL) 10 MG tablet Take 1 tablet (10 mg total) by mouth 3 (three) times daily as needed for muscle spasms.   [DISCONTINUED] methylPREDNISolone (MEDROL DOSEPAK) 4 MG TBPK tablet Take p.o. according to Dosepak instructions (Patient not taking: Reported on 02/14/2022)   [DISCONTINUED] ondansetron (ZOFRAN) 4 MG tablet Take 1 tablet (4 mg  total) by mouth every 8 (eight) hours as needed for nausea or vomiting. (Patient not taking: Reported on 02/14/2022)   No facility-administered encounter medications on file as of 02/14/2022.    Allergies (verified) Sulfa antibiotics   History: Past Medical History:  Diagnosis Date   Hyperlipemia    Hypertension    Insomnia    Past Surgical History:  Procedure Laterality Date   ABDOMINAL HYSTERECTOMY     BACK SURGERY     Family History  Problem Relation Age of Onset   Cancer Mother    Breast cancer Mother 105   Diabetes Maternal Grandfather    Stroke Maternal Grandfather    Social History   Socioeconomic History   Marital status: Divorced    Spouse name: Not on file   Number of children: 3   Years of education: Not on file   Highest education level: Not on file  Occupational History   Occupation: Part time  Tobacco Use   Smoking status: Former    Types: Cigarettes    Quit date: 11/13/2018    Years since quitting: 3.2   Smokeless tobacco: Never  Vaping Use   Vaping Use: Never used  Substance and Sexual Activity   Alcohol use: No   Drug use: No   Sexual activity: Never  Other Topics Concern   Not on file  Social History Narrative   Not on file   Social Determinants of Health  Financial Resource Strain: Low Risk  (02/14/2022)   Overall Financial Resource Strain (CARDIA)    Difficulty of Paying Living Expenses: Not hard at all  Food Insecurity: Not on file  Transportation Needs: Not on file  Physical Activity: Not on file  Stress: No Stress Concern Present (02/14/2022)   [employer] of Occupational Health - Occupational Stress Questionnaire    Feeling of Stress : Not at all  Social Connections: Not on file    Tobacco Counseling Counseling given: Not Answered   Clinical Intake:  Pre-visit preparation completed: No  Pain : No/denies pain Pain Score: 0-No pain     Nutritional Risks: None Diabetes: No  How often do you need to have someone  help you when you read instructions, pamphlets, or other written materials from your doctor or pharmacy?: 1 - Never  Diabetic?No       Information entered by :: Laurel Dimmer, CMA   Activities of Daily Living    02/14/2022    1:05 PM 06/14/2021    9:51 AM  In your present state of health, do you have any difficulty performing the following activities:  Hearing? 0 0  Vision? 1 0  Difficulty concentrating or making decisions? 0 0  Walking or climbing stairs? 0 0  Dressing or bathing? 0 0  Doing errands, shopping? 0 0    Patient Care Team: Duanne Limerick, MD as PCP - General (Family Medicine)  Indicate any recent Medical Services you may have received from other than [facility] providers in the past year (date may be approximate).    The pt was seen at [facility] on 7/8/2023for lower left sided back pain.  Assessment:   This is a routine wellness examination for Jakaylah.  Hearing/Vision screen Annual Eye exam, wear bifocal glasses  Denies any hearing loss.  Dietary issues and exercise activities discussed: Current Exercise Habits: Structured exercise class, Type of exercise: Other - see comments (exercise bike), Time (Minutes): 35, Frequency (Times/Week): 3, Weekly Exercise (Minutes/Week): 105, Intensity: Moderate   Goals Addressed             This Visit's Progress    Eat Healthy           Why is this important?   When you are ready to manage your nutrition or weight, having a plan and setting goals will help.  Taking small steps to change how you eat and exercise is a good place to start.    Notes:        Depression Screen    02/14/2022    1:09 PM 12/14/2021    7:58 AM 06/14/2021    9:51 AM 12/10/2020    9:59 AM 05/22/2020    9:24 AM 11/29/2019    9:36 AM 05/31/2019    9:57 AM  PHQ 2/9 Scores  PHQ - 2 Score 0 0 0 0 0 0 0  PHQ- 9 Score  0 0 0 0 0     Fall Risk    02/14/2022    1:21 PM 06/14/2021    9:51 AM 05/22/2020    9:24 AM 11/29/2019    9:35  AM 01/13/2016    2:48 PM  Fall Risk   Falls in the past year? 0 0 1 0 Yes  Number falls in past yr: 0 0 0    Injury with Fall? 0 0 0  No  Risk for fall due to : No Fall Risks      Follow up Falls evaluation  completed  Falls evaluation completed Falls evaluation completed     FALL RISK PREVENTION PERTAINING TO THE HOME:  Any stairs in or around the home? No   If so, are there any without handrails? Yes  Home free of loose throw rugs in walkways, pet beds, electrical cords, etc? Yes  Adequate lighting in your home to reduce risk of falls? Yes   ASSISTIVE DEVICES UTILIZED TO PREVENT FALLS:  Life alert? No  Use of a cane, walker or w/c? No  Grab bars in the bathroom? Yes  Shower chair or bench in shower? Yes  Elevated toilet seat or a handicapped toilet? No   TIMED UP AND GO:  Was the test performed? No . Virtual telephone visit   Cognitive Function:        02/14/2022    1:06 PM  6CIT Screen  What Year? 0 points  What month? 0 points  What time? 0 points  Count back from 20 0 points  Months in reverse 0 points  Repeat phrase 0 points  Total Score 0 points    Immunizations Immunization History  Administered Date(s) Administered   Fluad Quad(high Dose 65+) 06/14/2021   Influenza,inj,Quad PF,6+ Mos 05/22/2018, 05/31/2019   Influenza-Unspecified 05/27/2017, 05/31/2019, 05/15/2020   PPD Test 01/10/2016   Pneumococcal Conjugate-13 12/10/2020   Pneumococcal Polysaccharide-23 11/20/2017   Tdap 03/22/2017    TDAP status: Up to date  Flu Vaccine status: Up to date  Pneumococcal vaccine status: Up to date  Covid-19 vaccine status: Declined, Education has been provided regarding the importance of this vaccine but patient still declined. Advised may receive this vaccine at local pharmacy or Health Dept.or vaccine clinic. Aware to provide a copy of the vaccination record if obtained from local pharmacy or Health Dept. Verbalized acceptance and understanding.  Qualifies  for Shingles Vaccine? Yes   Zostavax completed No   Shingrix Completed?: No.    Education has been provided regarding the importance of this vaccine. Patient has been advised to call insurance company to determine out of pocket expense if they have not yet received this vaccine. Advised may also receive vaccine at local pharmacy or Health Dept. Verbalized acceptance and understanding.  Screening Tests Health Maintenance  Topic Date Due   COLONOSCOPY (Pts 45-47yrs Insurance coverage will need to be confirmed)  Never done   Zoster Vaccines- Shingrix (1 of 2) 03/16/2022 (Originally 05/07/2005)   DEXA SCAN  12/15/2022 (Originally 05/07/2020)   INFLUENZA VACCINE  03/01/2022   Pneumonia Vaccine 65+ Years old (3 - PPSV23 or PCV20) 11/21/2022   MAMMOGRAM  12/16/2022   TETANUS/TDAP  03/23/2027   Hepatitis C Screening  Completed   HPV VACCINES  Aged Out   COVID-19 Vaccine  Discontinued    Health Maintenance  Health Maintenance Due  Topic Date Due   COLONOSCOPY (Pts 45-75yrs Insurance coverage will need to be confirmed)  Never done    Colon Cancer Screening. The pt had an appt scheduled for her colonoscopy on 01/31/2022 and she state the provider cancelled. She said she will reschedule after August.  Mammogram status: Completed 12/10/2020. Repeat every year  DEXA Scan: Declined   Lung Cancer Screening: (Low Dose CT Chest recommended if Age 66-80 years, 30 pack-year currently smoking OR have quit w/in 15years.) does not qualify.   Lung Cancer Screening Referral:  does not qualify   Additional Screening:  Hepatitis C Screening: does qualify; Completed 03/22/2017   Vision Screening: Recommended annual ophthalmology exams for early detection of glaucoma and other  disorders of the eye. Is the patient up to date with their annual eye exam?  Yes  Who is the provider or what is the name of the office in which the patient attends annual eye exams?  If pt is not established with a provider, would  they like to be referred to a provider to establish care? No .   Dental Screening: Recommended annual dental exams for proper oral hygiene  Community Resource Referral / Chronic Care Management: CRR required this visit?  No   CCM required this visit?  No      Plan:     I have personally reviewed and noted the following in the patient's chart:   Medical and social history Use of alcohol, tobacco or illicit drugs  Current medications and supplements including opioid prescriptions.  Functional ability and status Nutritional status Physical activity Advanced directives List of other physicians Hospitalizations, surgeries, and ER visits in previous 12 months Vitals Screenings to include cognitive, depression, and falls Referrals and appointments  In addition, I have reviewed and discussed with patient certain preventive protocols, quality metrics, and best practice recommendations. A written personalized care plan for preventive services as well as general preventive health recommendations were provided to patient.    Ms. Choudhry , Thank you for taking time to come for your Medicare Wellness Visit. I appreciate your ongoing commitment to your health goals. Please review the following plan we discussed and let me know if I can assist you in the future.   These are the goals we discussed:  Goals      Eat Healthy         Why is this important?   When you are ready to manage your nutrition or weight, having a plan and setting goals will help.  Taking small steps to change how you eat and exercise is a good place to start.    Notes:         This is a list of the screening recommended for you and due dates:  Health Maintenance  Topic Date Due   Colon Cancer Screening  Never done   Zoster (Shingles) Vaccine (1 of 2) 03/16/2022*   DEXA scan (bone density measurement)  12/15/2022*   Flu Shot  03/01/2022   Pneumonia Vaccine (3 - PPSV23 or PCV20) 11/21/2022   Mammogram   12/16/2022   Tetanus Vaccine  03/23/2027   Hepatitis C Screening: USPSTF Recommendation to screen - Ages 18-79 yo.  Completed   HPV Vaccine  Aged Out   COVID-19 Vaccine  Discontinued  *Topic was postponed. The date shown is not the original due date.      Lonna Cobb, CMA   02/14/2022   Nurse Notes: None

## 2022-03-01 DIAGNOSIS — R3129 Other microscopic hematuria: Secondary | ICD-10-CM | POA: Diagnosis not present

## 2022-06-16 ENCOUNTER — Encounter: Payer: Self-pay | Admitting: Family Medicine

## 2022-06-16 ENCOUNTER — Ambulatory Visit (INDEPENDENT_AMBULATORY_CARE_PROVIDER_SITE_OTHER): Payer: Medicare Other | Admitting: Family Medicine

## 2022-06-16 VITALS — BP 148/100 | HR 78 | Ht 65.5 in | Wt 163.0 lb

## 2022-06-16 DIAGNOSIS — J301 Allergic rhinitis due to pollen: Secondary | ICD-10-CM | POA: Diagnosis not present

## 2022-06-16 DIAGNOSIS — E7800 Pure hypercholesterolemia, unspecified: Secondary | ICD-10-CM | POA: Diagnosis not present

## 2022-06-16 DIAGNOSIS — Z23 Encounter for immunization: Secondary | ICD-10-CM

## 2022-06-16 DIAGNOSIS — I10 Essential (primary) hypertension: Secondary | ICD-10-CM

## 2022-06-16 MED ORDER — LISINOPRIL-HYDROCHLOROTHIAZIDE 20-25 MG PO TABS: 1.0000 | ORAL_TABLET | Freq: Every day | ORAL | 0 refills | Status: DC

## 2022-06-16 MED ORDER — ATORVASTATIN CALCIUM 10 MG PO TABS: ORAL_TABLET | ORAL | 1 refills | Status: DC

## 2022-06-16 MED ORDER — FLUTICASONE PROPIONATE 50 MCG/ACT NA SUSP: 1.0000 | Freq: Every day | NASAL | 11 refills | Status: DC

## 2022-06-16 NOTE — Progress Notes (Signed)
Date:  06/16/2022   Name:  Julie Maldonado   DOB:  1956-02-25   MRN:  [id number]   Chief Complaint: Hyperlipidemia, Hypertension, Allergic Rhinitis , Flu Vaccine, and pneumonia 20  Hyperlipidemia This is a chronic problem. The current episode started more than 1 year ago. The problem is controlled. Recent lipid tests were reviewed and are normal. She has no history of chronic renal disease, diabetes, hypothyroidism, liver disease, obesity or nephrotic syndrome. There are no known factors aggravating her hyperlipidemia. Pertinent negatives include no chest pain, focal sensory loss, focal weakness, leg pain, myalgias or shortness of breath. Current antihyperlipidemic treatment includes statins. The current treatment provides moderate improvement of lipids. There are no compliance problems.  Risk factors for coronary artery disease include dyslipidemia and hypertension.  Hypertension This is a chronic problem. The current episode started more than 1 year ago. The problem has been waxing and waning since onset. The problem is controlled. Pertinent negatives include no anxiety, blurred vision, chest pain, headaches, malaise/fatigue, neck pain, orthopnea, palpitations, peripheral edema, PND, shortness of breath or sweats. There are no associated agents to hypertension. There are no known risk factors for coronary artery disease. Past treatments include ACE inhibitors and diuretics. The current treatment provides moderate improvement. There are no compliance problems.  There is no history of angina, kidney disease, CAD/MI, CVA, heart failure, left ventricular hypertrophy, PVD or retinopathy. There is no history of chronic renal disease, a hypertension causing med or renovascular disease.    Lab Results  Component Value Date   NA 140 12/14/2021   K 4.1 12/14/2021   CO2 22 12/14/2021   GLUCOSE 109 (H) 12/14/2021   BUN 19 12/14/2021   CREATININE 1.18 (H) 12/14/2021   CALCIUM 10.0 12/14/2021   EGFR 51  (L) 12/14/2021   GFRNONAA 68 05/22/2020   Lab Results  Component Value Date   CHOL 203 (H) 12/14/2021   HDL 61 12/14/2021   LDLCALC 128 (H) 12/14/2021   TRIG 76 12/14/2021   CHOLHDL 3.7 05/31/2019   No results found for: "TSH" No results found for: "HGBA1C" Lab Results  Component Value Date   WBC 8.7 08/15/2011   HGB 14.3 08/15/2011   HCT 42.6 08/15/2011   MCV 92 08/15/2011   PLT 191 08/15/2011   Lab Results  Component Value Date   ALT 16 12/14/2021   AST 13 12/14/2021   ALKPHOS 92 12/14/2021   BILITOT 0.3 12/14/2021   No results found for: "25OHVITD2", "25OHVITD3", "VD25OH"   Review of Systems  Constitutional: Negative.  Negative for chills, fatigue, fever, malaise/fatigue and unexpected weight change.  HENT:  Negative for congestion, ear discharge, ear pain, rhinorrhea, sinus pressure, sneezing and sore throat.   Eyes:  Negative for blurred vision.  Respiratory:  Negative for cough, shortness of breath, wheezing and stridor.   Cardiovascular:  Negative for chest pain, palpitations, orthopnea and PND.  Gastrointestinal:  Negative for abdominal pain, blood in stool, constipation, diarrhea and nausea.  Genitourinary:  Negative for dysuria, flank pain, frequency, hematuria, urgency and vaginal discharge.  Musculoskeletal:  Negative for arthralgias, back pain, myalgias and neck pain.  Skin:  Negative for rash.  Neurological:  Negative for dizziness, focal weakness, weakness and headaches.  Hematological:  Negative for adenopathy. Does not bruise/bleed easily.  Psychiatric/Behavioral:  Negative for dysphoric mood. The patient is not nervous/anxious.     Patient Active Problem List   Diagnosis Date Noted   Essential hypertension 04/20/2016   Hyperlipidemia 04/20/2016  Acute maxillary sinusitis 04/20/2016    Allergies  Allergen Reactions   Sulfa Antibiotics Hives    Other reaction(s): Unknown    Past Surgical History:  Procedure Laterality Date   ABDOMINAL  HYSTERECTOMY     BACK SURGERY      Social History   Tobacco Use   Smoking status: Former    Types: Cigarettes    Quit date: 11/13/2018    Years since quitting: 3.5   Smokeless tobacco: Never  Vaping Use   Vaping Use: Never used  Substance Use Topics   Alcohol use: No   Drug use: No     Medication list has been reviewed and updated.  Current Meds  Medication Sig   aspirin 81 MG tablet Take 81 mg by mouth daily.   atorvastatin (LIPITOR) 10 MG tablet TAKE 1 TABLET(10 MG) BY MOUTH DAILY   fluticasone (FLONASE) 50 MCG/ACT nasal spray Place 1 spray into both nostrils daily.   lisinopril-hydrochlorothiazide (ZESTORETIC) 10-12.5 MG tablet Take 1 tablet by mouth daily.   meclizine (ANTIVERT) 25 MG tablet Take 1 tablet (25 mg total) by mouth 3 (three) times daily as needed for dizziness.   Omega 3 1000 MG CAPS Take 1 capsule (1,000 mg total) by mouth 2 (two) times daily.       06/16/2022    7:53 AM 12/14/2021    7:58 AM 06/14/2021    9:51 AM 12/10/2020    9:59 AM  GAD 7 : Generalized Anxiety Score  Nervous, Anxious, on Edge 0 0 0 0  Control/stop worrying 0 0 0 0  Worry too much - different things 0 0 0 0  Trouble relaxing 0 0 0 0  Restless 0 0 0 0  Easily annoyed or irritable 0 0 0 0  Afraid - awful might happen 0 0 0 0  Total GAD 7 Score 0 0 0 0  Anxiety Difficulty Not difficult at all Not difficult at all Not difficult at all        06/16/2022    7:53 AM 02/14/2022    1:09 PM 12/14/2021    7:58 AM  Depression screen PHQ 2/9  Decreased Interest 0 0 0  Down, Depressed, Hopeless 0 0 0  PHQ - 2 Score 0 0 0  Altered sleeping 0  0  Tired, decreased energy 0  0  Change in appetite 0  0  Feeling bad or failure about yourself  0  0  Trouble concentrating 0  0  Moving slowly or fidgety/restless 0  0  Suicidal thoughts 0  0  PHQ-9 Score 0  0  Difficult doing work/chores Not difficult at all  Not difficult at all    BP Readings from Last 3 Encounters:  06/16/22 (!)  148/100  02/05/22 (!) 145/75  12/14/21 130/80    Physical Exam Vitals and nursing note reviewed. Exam conducted with a chaperone present.  Constitutional:      General: She is not in acute distress.    Appearance: She is not diaphoretic.  HENT:     Head: Normocephalic and atraumatic.     Right Ear: External ear normal.     Left Ear: External ear normal.     Nose: Nose normal.  Eyes:     General:        Right eye: No discharge.        Left eye: No discharge.     Conjunctiva/sclera: Conjunctivae normal.     Pupils: Pupils are equal, round, and reactive  to light.  Neck:     Thyroid: No thyromegaly.     Vascular: No JVD.  Cardiovascular:     Rate and Rhythm: Normal rate and regular rhythm.     Heart sounds: Normal heart sounds. No murmur heard.    No friction rub. No gallop.  Pulmonary:     Effort: Pulmonary effort is normal.     Breath sounds: Normal breath sounds.  Abdominal:     General: Bowel sounds are normal.     Palpations: Abdomen is soft. There is no mass.     Tenderness: There is no abdominal tenderness. There is no guarding.  Musculoskeletal:        General: Normal range of motion.     Cervical back: Normal range of motion and neck supple.  Lymphadenopathy:     Cervical: No cervical adenopathy.  Skin:    General: Skin is warm and dry.  Neurological:     Mental Status: She is alert.     Deep Tendon Reflexes: Reflexes are normal and symmetric.     Wt Readings from Last 3 Encounters:  06/16/22 163 lb (73.9 kg)  02/05/22 166 lb 0.1 oz (75.3 kg)  12/14/21 166 lb (75.3 kg)    BP (!) 148/100 (BP Location: Right Arm, Cuff Size: Large)   Pulse 78   Ht 5' 5.5" (1.664 m)   Wt 163 lb (73.9 kg)   SpO2 98%   BMI 26.71 kg/m   Assessment and Plan:  1. Essential hypertension Chronic.  Uncontrolled.  Stable.  Patient has taken her blood pressure medicine at night and she will begin taking it in the morning and patient is also been taking some allergy medications  but it does not sound like there is been Sudafed involved.  Blood pressure today is 148/100 and we will increase the dosing of lisinopril hydrochlorothiazide to 20-25 mg once a day and will recheck blood pressure in [redacted] weeks along with checking renal function panel for electrolytes and GFR. - lisinopril-hydrochlorothiazide (ZESTORETIC) 20-25 MG tablet; Take 1 tablet by mouth daily.  Dispense: 60 tablet; Refill: 0  2. Pure hypercholesterolemia Chronic.  Controlled.  Stable.  Continue atorvastatin 10 mg once a day. - atorvastatin (LIPITOR) 10 MG tablet; TAKE 1 TABLET(10 MG) BY MOUTH DAILY  Dispense: 90 tablet; Refill: 1  3. Seasonal allergic rhinitis due to pollen Patient is doing the thing that everybody [place] is doing for some reason and abdomen that is blowing leaves without a mask.  We will initiate Flonase nasal spray 1 spray both nostrils daily. - fluticasone (FLONASE) 50 MCG/ACT nasal spray; Place 1 spray into both nostrils daily.  Dispense: 16 g; Refill: 11  4. Need for immunization against influenza Discussed and administered. - Flu Vaccine QUAD High Dose(Fluad)  5. Need for pneumococcal 20-valent conjugate vaccination Discussed and administered. - Pneumococcal conjugate vaccine 20-valent (Prevnar 20)    Otilio Miu, MD

## 2022-06-17 ENCOUNTER — Other Ambulatory Visit: Payer: Self-pay | Admitting: Family Medicine

## 2022-06-17 DIAGNOSIS — I10 Essential (primary) hypertension: Secondary | ICD-10-CM

## 2022-06-17 MED ORDER — LISINOPRIL-HYDROCHLOROTHIAZIDE 20-25 MG PO TABS: 1.0000 | ORAL_TABLET | Freq: Every day | ORAL | 0 refills | Status: DC

## 2022-07-28 ENCOUNTER — Encounter: Payer: Self-pay | Admitting: Family Medicine

## 2022-07-28 ENCOUNTER — Ambulatory Visit (INDEPENDENT_AMBULATORY_CARE_PROVIDER_SITE_OTHER): Payer: Medicare Other | Admitting: Family Medicine

## 2022-07-28 VITALS — BP 120/72 | HR 72 | Ht 65.5 in | Wt 166.0 lb

## 2022-07-28 DIAGNOSIS — I10 Essential (primary) hypertension: Secondary | ICD-10-CM | POA: Diagnosis not present

## 2022-07-28 MED ORDER — LISINOPRIL-HYDROCHLOROTHIAZIDE 20-25 MG PO TABS: 1.0000 | ORAL_TABLET | Freq: Every day | ORAL | 1 refills | Status: DC

## 2022-07-28 NOTE — Progress Notes (Signed)
Date:  07/28/2022   Name:  Julie Maldonado   DOB:  1956/05/22   MRN:  [id number]   Chief Complaint: Hypertension  Hypertension This is a chronic problem. The current episode started more than 1 year ago. The problem has been gradually improving since onset. The problem is controlled. Pertinent negatives include no blurred vision, chest pain, headaches, orthopnea, palpitations, peripheral edema, PND or shortness of breath. There are no associated agents to hypertension. There are no known risk factors for coronary artery disease. Past treatments include ACE inhibitors and diuretics. The current treatment provides moderate improvement. There are no compliance problems.  There is no history of angina, kidney disease, CAD/MI, CVA, heart failure, left ventricular hypertrophy, PVD or retinopathy. There is no history of chronic renal disease, a hypertension causing med or renovascular disease.    Lab Results  Component Value Date   NA 140 12/14/2021   K 4.1 12/14/2021   CO2 22 12/14/2021   GLUCOSE 109 (H) 12/14/2021   BUN 19 12/14/2021   CREATININE 1.18 (H) 12/14/2021   CALCIUM 10.0 12/14/2021   EGFR 51 (L) 12/14/2021   GFRNONAA 68 05/22/2020   Lab Results  Component Value Date   CHOL 203 (H) 12/14/2021   HDL 61 12/14/2021   LDLCALC 128 (H) 12/14/2021   TRIG 76 12/14/2021   CHOLHDL 3.7 05/31/2019   No results found for: "TSH" No results found for: "HGBA1C" Lab Results  Component Value Date   WBC 8.7 08/15/2011   HGB 14.3 08/15/2011   HCT 42.6 08/15/2011   MCV 92 08/15/2011   PLT 191 08/15/2011   Lab Results  Component Value Date   ALT 16 12/14/2021   AST 13 12/14/2021   ALKPHOS 92 12/14/2021   BILITOT 0.3 12/14/2021   No results found for: "25OHVITD2", "25OHVITD3", "VD25OH"   Review of Systems  Constitutional: Negative.  Negative for chills, fatigue, fever and unexpected weight change.  HENT:  Negative for congestion, ear discharge, ear pain, postnasal drip,  rhinorrhea, sinus pressure, sneezing and sore throat.   Eyes:  Negative for blurred vision.  Respiratory:  Negative for cough, shortness of breath, wheezing and stridor.   Cardiovascular:  Negative for chest pain, palpitations, orthopnea and PND.  Gastrointestinal:  Negative for abdominal pain, blood in stool, constipation, diarrhea and nausea.  Genitourinary:  Negative for dysuria, flank pain, frequency, hematuria and urgency.  Musculoskeletal:  Negative for back pain and myalgias.  Skin:  Negative for rash.  Neurological:  Negative for dizziness, weakness and headaches.  Hematological:  Negative for adenopathy. Does not bruise/bleed easily.  Psychiatric/Behavioral:  Negative for dysphoric mood. The patient is not nervous/anxious.     Patient Active Problem List   Diagnosis Date Noted   Essential hypertension 04/20/2016   Hyperlipidemia 04/20/2016   Acute maxillary sinusitis 04/20/2016    Allergies  Allergen Reactions   Sulfa Antibiotics Hives    Other reaction(s): Unknown    Past Surgical History:  Procedure Laterality Date   ABDOMINAL HYSTERECTOMY     BACK SURGERY      Social History   Tobacco Use   Smoking status: Former    Types: Cigarettes    Quit date: 11/13/2018    Years since quitting: 3.7   Smokeless tobacco: Never  Vaping Use   Vaping Use: Never used  Substance Use Topics   Alcohol use: No   Drug use: No     Medication list has been reviewed and updated.  Current Meds  Medication  Sig   aspirin 81 MG tablet Take 81 mg by mouth daily.   atorvastatin (LIPITOR) 10 MG tablet TAKE 1 TABLET(10 MG) BY MOUTH DAILY   fluticasone (FLONASE) 50 MCG/ACT nasal spray Place 1 spray into both nostrils daily.   lisinopril-hydrochlorothiazide (ZESTORETIC) 20-25 MG tablet Take 1 tablet by mouth daily.   meclizine (ANTIVERT) 25 MG tablet Take 1 tablet (25 mg total) by mouth 3 (three) times daily as needed for dizziness.   Omega 3 1000 MG CAPS Take 1 capsule (1,000 mg  total) by mouth 2 (two) times daily.       07/28/2022    1:27 PM 06/16/2022    7:53 AM 12/14/2021    7:58 AM 06/14/2021    9:51 AM  GAD 7 : Generalized Anxiety Score  Nervous, Anxious, on Edge 0 0 0 0  Control/stop worrying 0 0 0 0  Worry too much - different things 0 0 0 0  Trouble relaxing 0 0 0 0  Restless 0 0 0 0  Easily annoyed or irritable 0 0 0 0  Afraid - awful might happen 0 0 0 0  Total GAD 7 Score 0 0 0 0  Anxiety Difficulty Not difficult at all Not difficult at all Not difficult at all Not difficult at all       07/28/2022    1:27 PM 06/16/2022    7:53 AM 02/14/2022    1:09 PM  Depression screen PHQ 2/9  Decreased Interest 0 0 0  Down, Depressed, Hopeless 0 0 0  PHQ - 2 Score 0 0 0  Altered sleeping 0 0   Tired, decreased energy 0 0   Change in appetite 0 0   Feeling bad or failure about yourself  0 0   Trouble concentrating 0 0   Moving slowly or fidgety/restless 0 0   Suicidal thoughts 0 0   PHQ-9 Score 0 0   Difficult doing work/chores Not difficult at all Not difficult at all     BP Readings from Last 3 Encounters:  07/28/22 120/72  06/16/22 (!) 148/100  02/05/22 (!) 145/75    Physical Exam Vitals and nursing note reviewed.  HENT:     Head: Normocephalic.     Right Ear: Tympanic membrane, ear canal and external ear normal.     Left Ear: Tympanic membrane, ear canal and external ear normal.     Nose: Nose normal.     Mouth/Throat:     Mouth: Mucous membranes are moist.     Pharynx: Oropharynx is clear. No oropharyngeal exudate or posterior oropharyngeal erythema.  Eyes:     Pupils: Pupils are equal, round, and reactive to light.  Cardiovascular:     Rate and Rhythm: Normal rate.     Pulses: Normal pulses.     Heart sounds: Normal heart sounds and S1 normal. No murmur heard.    No systolic murmur is present.     No diastolic murmur is present.     No friction rub. No gallop. No S3 or S4 sounds.  Pulmonary:     Effort: No respiratory  distress.     Breath sounds: No stridor. No wheezing, rhonchi or rales.  Chest:     Chest wall: No tenderness.  Abdominal:     Palpations: Abdomen is soft.  Musculoskeletal:     Cervical back: Normal range of motion.     Right lower leg: No edema.     Left lower leg: No edema.  Neurological:  Mental Status: She is alert.     Wt Readings from Last 3 Encounters:  07/28/22 166 lb (75.3 kg)  06/16/22 163 lb (73.9 kg)  02/05/22 166 lb 0.1 oz (75.3 kg)    BP 120/72   Pulse 72   Ht 5' 5.5" (1.664 m)   Wt 166 lb (75.3 kg)   SpO2 97%   BMI 27.20 kg/m   Assessment and Plan: 1. Essential hypertension Chronic.  Controlled.  Stable.  Blood pressure today 120/72.  Asymptomatic.  Tolerating increased dosing and will continue lisinopril hydrochlorothiazide 20-25 mg daily.  Will check renal function panel to evaluate electrolytes and GFR.  We will recheck patient in 6 months. - lisinopril-hydrochlorothiazide (ZESTORETIC) 20-25 MG tablet; Take 1 tablet by mouth daily.  Dispense: 90 tablet; Refill: 1 - Renal Function Panel     Otilio Miu, MD

## 2022-07-29 LAB — RENAL FUNCTION PANEL
Albumin: 4.6 g/dL (ref 3.9–4.9)
BUN/Creatinine Ratio: 25 (ref 12–28)
BUN: 32 mg/dL — ABNORMAL HIGH (ref 8–27)
CO2: 20 mmol/L (ref 20–29)
Calcium: 9.8 mg/dL (ref 8.7–10.3)
Chloride: 98 mmol/L (ref 96–106)
Creatinine, Ser: 1.28 mg/dL — ABNORMAL HIGH (ref 0.57–1.00)
Glucose: 96 mg/dL (ref 70–99)
Phosphorus: 3.3 mg/dL (ref 3.0–4.3)
Potassium: 3.7 mmol/L (ref 3.5–5.2)
Sodium: 136 mmol/L (ref 134–144)
eGFR: 46 mL/min/{1.73_m2} — ABNORMAL LOW (ref >59)

## 2022-10-12 IMAGING — MG MM DIGITAL DIAGNOSTIC UNILAT*L* W/ TOMO W/ CAD
8 series · 8 of 24 positions shown · non-contrast
Comparison: Previous exam(s).

CLINICAL DATA: 64-year-old female presenting as a recall from
screening for possible left breast asymmetries.

EXAM:
DIGITAL DIAGNOSTIC UNILATERAL LEFT MAMMOGRAM WITH TOMOSYNTHESIS AND
CAD; ULTRASOUND LEFT BREAST LIMITED
TECHNIQUE: Left digital diagnostic mammography and breast tomosynthesis was
performed. The images were evaluated with computer-aided detection.;
Targeted ultrasound examination of the left breast was performed

[L CC synth-2D (1 of 3)]
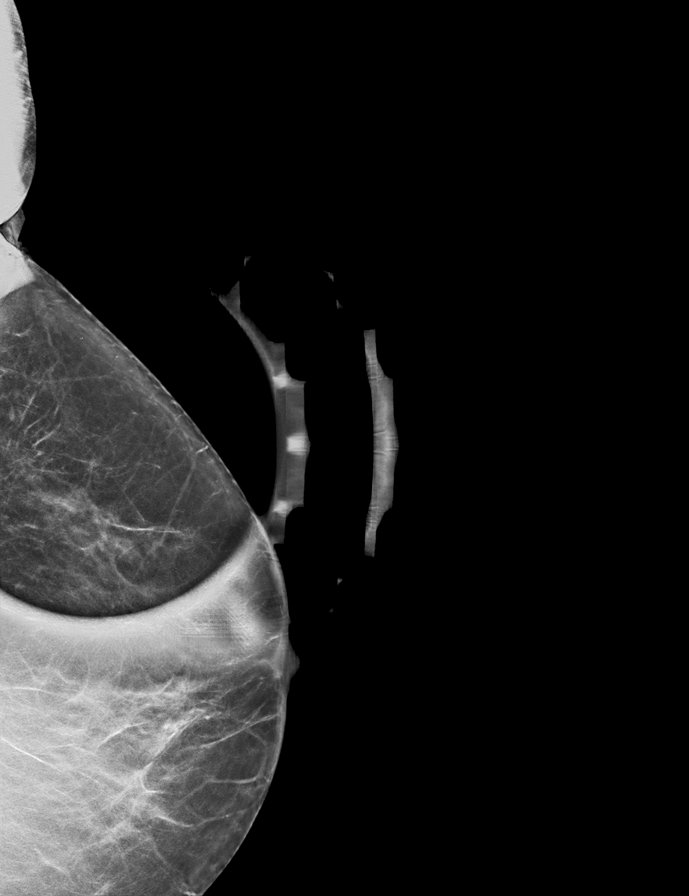

[L MLO synth-2D]
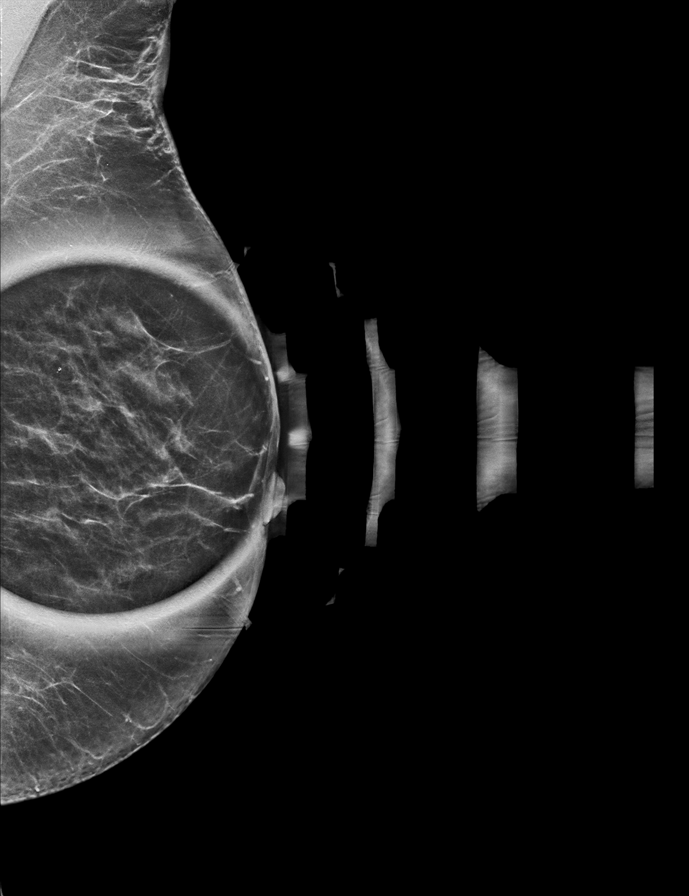

[L CC synth-2D (2 of 3)]
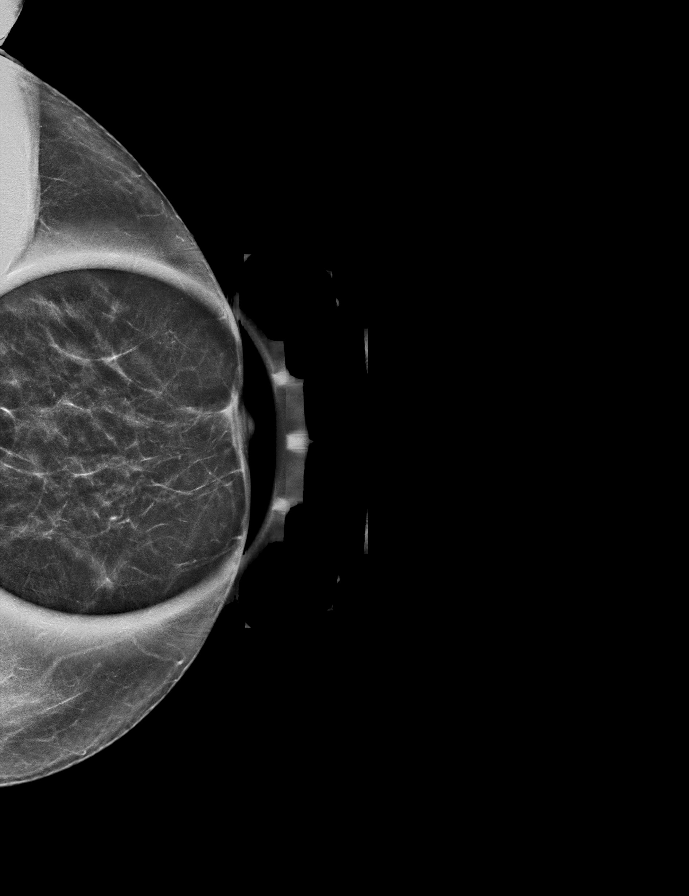

[L CC synth-2D (3 of 3)]
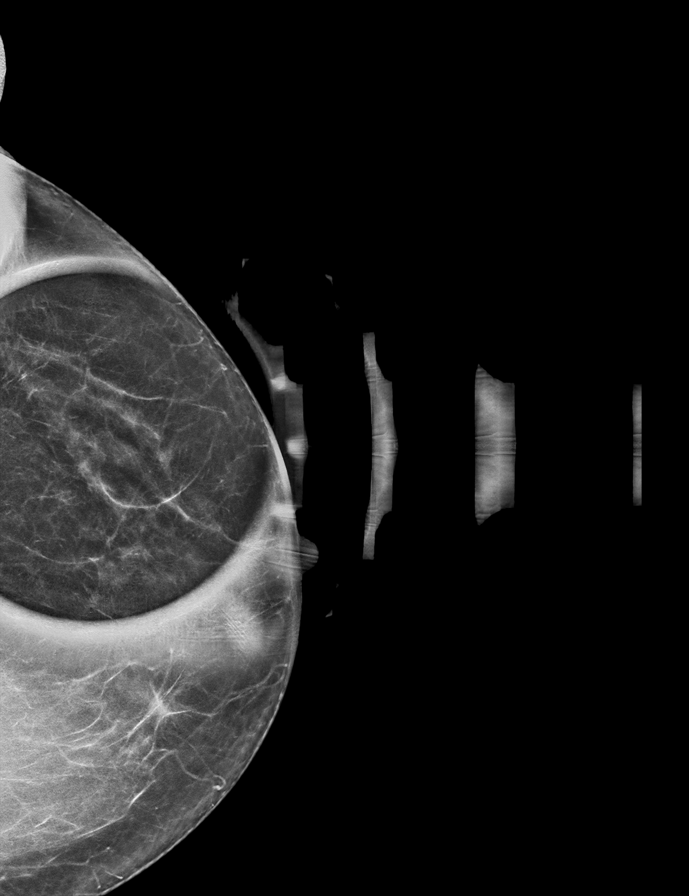

[L CC tomo (1 of 3) · tomo slice 27/54.0]
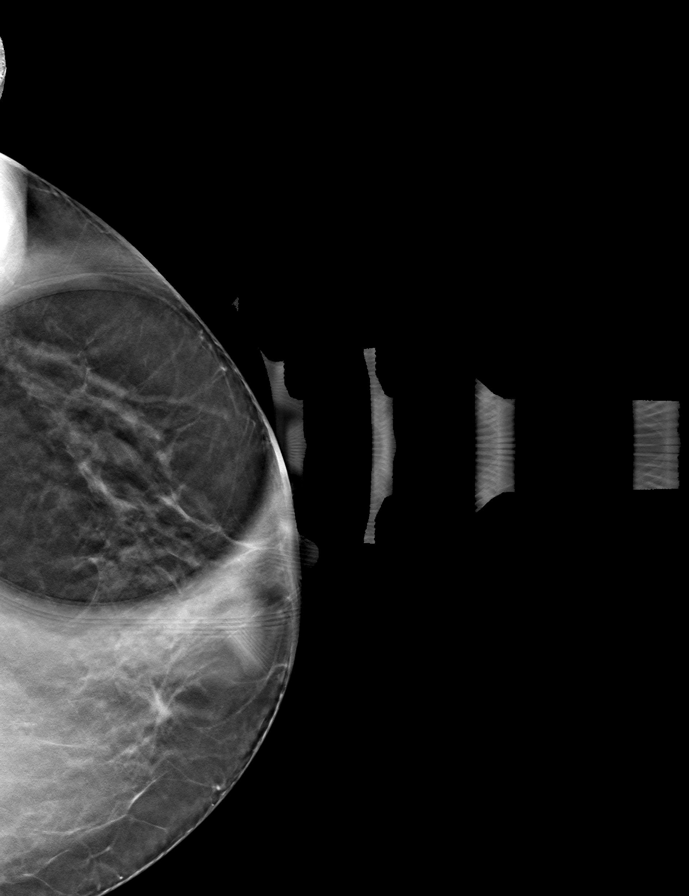

[L CC tomo (2 of 3) · tomo slice 26/51.0]
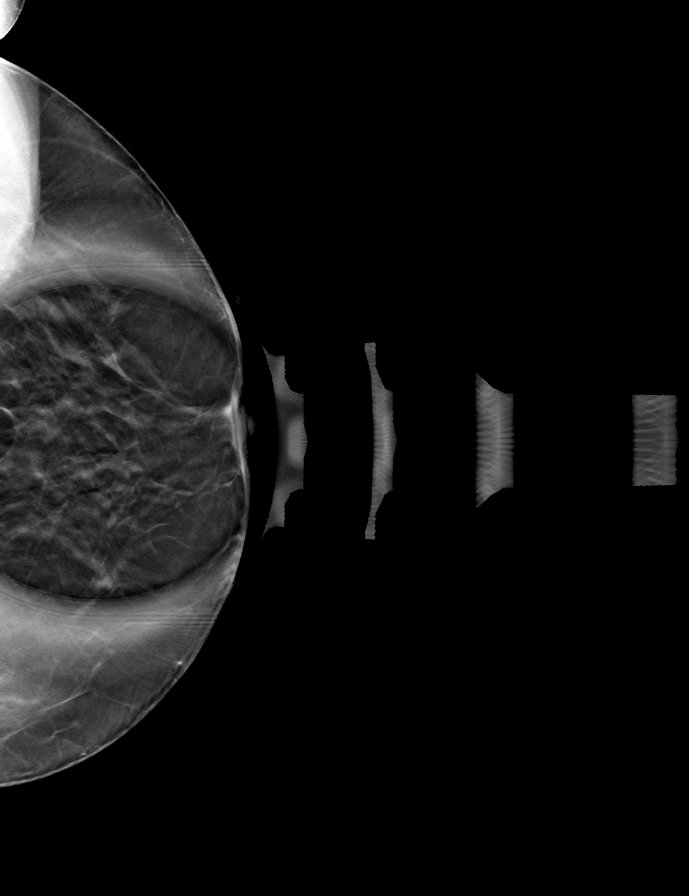

[L CC tomo (3 of 3) · tomo slice 27/52.0]
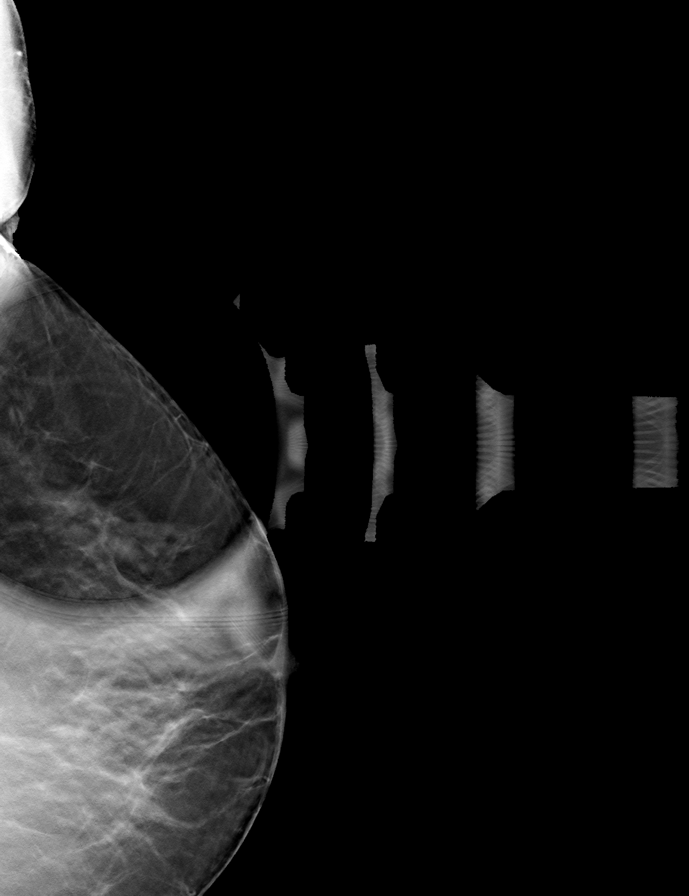

[L MLO tomo · tomo slice 27/54.0]
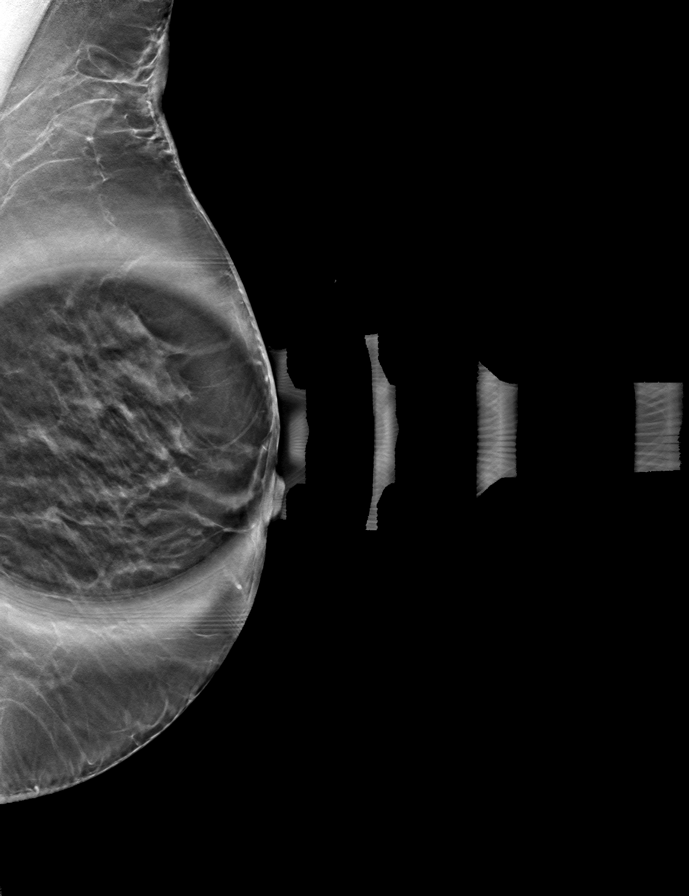

[8 of 24 positions shown; findings below may reference images not displayed]

ACR Breast Density Category c: The breast tissue is heterogeneously
dense, which may obscure small masses.
FINDINGS: Mammogram:

Spot compression tomosynthesis views of the left breast were
performed for questioned asymmetries in the upper outer and upper
central left breast. On the additional imaging the asymmetry in the
upper outer left breast resolves though there is a possible small
obscured mass nearby. The asymmetry in the superior central aspect
of the breast does not persist on the additional imaging and likely
represented normal overlapping fibroglandular tissue.

Ultrasound:

Targeted ultrasound performed throughout the upper-outer quadrant of
the left breast demonstrating an oval circumscribed hypoechoic mass
at 3 o'clock 3 cm from the nipple measuring 0.6 x 0.3 x 0.6 cm,
likely a complicated cyst. No internal vascularity. At 2 o'clock 4
cm from the nipple there is an oval circumscribed hypoechoic mass
with thin septation measuring 0.8 x 0.4 x 0.9 cm, likely
representing apocrine metaplasia. This most likely corresponds to
the obscured mass identified on mammogram. There is no suspicious
solid mass or distortion visualized.
IMPRESSION: Probably benign masses in the left breast at 2 o'clock and 3
o'clock, likely representing a complicated cyst and apocrine
metaplasia.

RECOMMENDATION:
Left breast ultrasound in 6 months.

I have discussed the findings and recommendations with the patient
who agrees to proceed with short-term follow-up. If applicable, a
reminder letter will be sent to the patient regarding the next
appointment.

BI-RADS CATEGORY  3: Probably benign.

## 2022-11-11 ENCOUNTER — Ambulatory Visit
Admission: EM | Admit: 2022-11-11 | Discharge: 2022-11-11 | Disposition: A | Discharge status: Home / Self Care | Payer: Medicare Other | Attending: Emergency Medicine | Admitting: Emergency Medicine

## 2022-11-11 DIAGNOSIS — R300 Dysuria: Secondary | ICD-10-CM | POA: Diagnosis not present

## 2022-11-11 DIAGNOSIS — R350 Frequency of micturition: Secondary | ICD-10-CM | POA: Diagnosis not present

## 2022-11-11 DIAGNOSIS — R1032 Left lower quadrant pain: Secondary | ICD-10-CM | POA: Diagnosis not present

## 2022-11-11 DIAGNOSIS — B3731 Acute candidiasis of vulva and vagina: Secondary | ICD-10-CM | POA: Diagnosis not present

## 2022-11-11 LAB — URINALYSIS, W/ REFLEX TO CULTURE (INFECTION SUSPECTED)
Bilirubin Urine: NEGATIVE
Glucose, UA: NEGATIVE mg/dL
Hgb urine dipstick: NEGATIVE
Ketones, ur: NEGATIVE mg/dL
Nitrite: NEGATIVE
Protein, ur: NEGATIVE mg/dL
Specific Gravity, Urine: 1.015 (ref 1.005–1.030)
pH: 6.5 (ref 5.0–8.0)

## 2022-11-11 MED ORDER — FLUCONAZOLE 150 MG PO TABS: 150.0000 mg | ORAL_TABLET | Freq: Every day | ORAL | 0 refills | Status: AC

## 2022-11-11 NOTE — ED Provider Notes (Signed)
[facility]    CSN: [id number] Arrival date & time: 11/11/22  1702      History   Chief Complaint Chief Complaint  Patient presents with   Urinary Tract Infection    HPI Julie Maldonado is a 67 y.o. female.   Patient presents for evaluation of constant pain to the left flank radiating to the left groin, urinary frequency and dysuria beginning 1 day ago.  Has attempted to increase fluid intake which has been ineffective.  Denies hematuria, fevers, urgency, vaginal symptoms.    Past Medical History:  Diagnosis Date   Hyperlipemia    Hypertension    Insomnia     Patient Active Problem List   Diagnosis Date Noted   Essential hypertension 04/20/2016   Hyperlipidemia 04/20/2016   Acute maxillary sinusitis 04/20/2016    Past Surgical History:  Procedure Laterality Date   ABDOMINAL HYSTERECTOMY     BACK SURGERY      OB History   No obstetric history on file.      Home Medications    Prior to Admission medications   Medication Sig Start Date End Date Taking? Authorizing Provider  aspirin 81 MG tablet Take 81 mg by mouth daily.   Yes [provider]  atorvastatin (LIPITOR) 10 MG tablet TAKE 1 TABLET(10 MG) BY MOUTH DAILY 06/16/22  Yes Duanne Limerick, MD  fluticasone (FLONASE) 50 MCG/ACT nasal spray Place 1 spray into both nostrils daily. 06/16/22  Yes Duanne Limerick, MD  lisinopril-hydrochlorothiazide (ZESTORETIC) 20-25 MG tablet Take 1 tablet by mouth daily. 07/28/22  Yes Duanne Limerick, MD  meclizine (ANTIVERT) 25 MG tablet Take 1 tablet (25 mg total) by mouth 3 (three) times daily as needed for dizziness. 03/23/20   Lamptey, Britta Mccreedy, MD  Omega 3 1000 MG CAPS Take 1 capsule (1,000 mg total) by mouth 2 (two) times daily. 05/22/20   Duanne Limerick, MD    Family History Family History  Problem Relation Age of Onset   Cancer Mother    Breast cancer Mother 71   Diabetes Maternal Grandfather    Stroke Maternal Grandfather     Social  History Social History   Tobacco Use   Smoking status: Former    Types: Cigarettes    Quit date: 11/13/2018    Years since quitting: 3.9   Smokeless tobacco: Never  Vaping Use   Vaping Use: Never used  Substance Use Topics   Alcohol use: No   Drug use: No     Allergies   Sulfa antibiotics   Review of Systems Review of Systems   Physical Exam Triage Vital Signs ED Triage Vitals  Enc Vitals Group     BP 11/11/22 1715 112/77     Pulse Rate 11/11/22 1715 76     Resp --      Temp 11/11/22 1715 (!) 97.4 F (36.3 C)     Temp Source 11/11/22 1715 Oral     SpO2 11/11/22 1715 95 %     Weight 11/11/22 1714 162 lb (73.5 kg)     Height 11/11/22 1714 5\' 5"  (1.651 m)     Head Circumference --      Peak Flow --      Pain Score 11/11/22 1714 5     Pain Loc --      Pain Edu? --      Excl. in GC? --    No data found.  Updated Vital Signs BP 112/77 (BP Location: Left  Arm)   Pulse 76   Temp (!) 97.4 F (36.3 C) (Oral)   Ht  (1.651 m)   Wt 162 lb (73.5 kg)   SpO2 95%   BMI 26.96 kg/m   Visual Acuity Right Eye Distance:   Left Eye Distance:   Bilateral Distance:    Right Eye Near:   Left Eye Near:    Bilateral Near:     Physical Exam Constitutional:      Appearance: Normal appearance.  Eyes:     Extraocular Movements: Extraocular movements intact.  Pulmonary:     Effort: Pulmonary effort is normal.  Neurological:     Mental Status: She is alert and oriented to person, place, and time. Mental status is at baseline.      UC Treatments / Results  Labs (all labs ordered are listed, but only abnormal results are displayed) Labs Reviewed  URINALYSIS, W/ REFLEX TO CULTURE (INFECTION SUSPECTED) - Abnormal; Notable for the following components:      Result Value   APPearance HAZY (*)    Leukocytes,Ua LARGE (*)    Bacteria, UA FEW (*)    All other components within normal limits  URINE CULTURE    EKG   Radiology No results  found.  Procedures Procedures (including critical care time)  Medications Ordered in UC Medications - No data to display  Initial Impression / Assessment and Plan / UC Course  I have reviewed the triage vital signs and the nursing notes.  Pertinent labs & imaging results that were available during my care of the patient were reviewed by me and considered in my medical decision making (see chart for details).  Yeast vaginitis  Urinalysis showing leukocytes, negative for nitrates, positive for yeast, discussed findings with patient, urine sent for culture will defer antibiotic use until culture results, discussed this with patient, will move forward with treatment of yeast, Diflucan prescribed and discussed administration, recommended additional supportive measures, may follow-up with urgent care or PCP if symptoms persist worsen or recur Final Clinical Impressions(s) / UC Diagnoses   Final diagnoses:  None   Discharge Instructions   None    ED Prescriptions   None    PDMP not reviewed this encounter.   Valinda Hoar, NP 11/11/22 1747

## 2022-11-11 NOTE — Discharge Instructions (Signed)
Urinalysis shows Corona Popovich blood cells but does not show bacteria therefore has been sent to the lab to determine if bacteria will grow, if this occurs you will be notified and antibiotics sent in at time of notification  Urinalysis did show yeast which is most likely the cause of your symptoms will begin treatment for this  Take 1 tablet of Diflucan when you receive your medicine then in 3 days if you are still having symptoms take second dose  You may continue to increase your fluid intake through primarily water to help flush out the body  You may use Pyridium as needed which may help to minimize your urinary symptoms, this medicine will turn your urine a bright orange, this is normal  You may use over-the-counter Tylenol or Motrin every 6 hours as needed for general comfort  Avoid scented vaginal products such as soaps and cleansers as this may cause further irritation  You may follow-up with his urgent care as needed if symptoms persist or worsen

## 2022-11-11 NOTE — ED Triage Notes (Signed)
Pt c/o Lower left groin pain, urinary burning and frequency x2days

## 2022-11-13 LAB — URINE CULTURE: Culture: 30000 — AB

## 2022-11-14 ENCOUNTER — Ambulatory Visit (INDEPENDENT_AMBULATORY_CARE_PROVIDER_SITE_OTHER): Payer: Medicare Other | Admitting: Family Medicine

## 2022-11-14 ENCOUNTER — Encounter: Payer: Self-pay | Admitting: Family Medicine

## 2022-11-14 VITALS — BP 150/108 | HR 78 | Temp 97.9°F | Ht 65.0 in | Wt 161.0 lb

## 2022-11-14 DIAGNOSIS — N3090 Cystitis, unspecified without hematuria: Secondary | ICD-10-CM | POA: Diagnosis not present

## 2022-11-14 DIAGNOSIS — R300 Dysuria: Secondary | ICD-10-CM | POA: Diagnosis not present

## 2022-11-14 LAB — POCT URINALYSIS DIPSTICK
Bilirubin, UA: NEGATIVE
Blood, UA: NEGATIVE
Glucose, UA: NEGATIVE
Ketones, UA: NEGATIVE
Nitrite, UA: NEGATIVE
Protein, UA: NEGATIVE
Spec Grav, UA: 1.01 (ref 1.010–1.025)
Urobilinogen, UA: 0.2 E.U./dL
pH, UA: 6 (ref 5.0–8.0)

## 2022-11-14 MED ORDER — NITROFURANTOIN MONOHYD MACRO 100 MG PO CAPS
100.0000 mg | ORAL_CAPSULE | Freq: Two times a day (BID) | ORAL | 0 refills | Status: AC
Start: 2022-11-14 — End: 2022-11-21

## 2022-11-14 NOTE — Progress Notes (Signed)
Date:  11/14/2022   Name:  Julie Maldonado   DOB:  1956/03/09   MRN:  [id number]   Chief Complaint: Urinary Tract Infection  Urinary Tract Infection  This is a new problem. The current episode started in the past 7 days. The problem occurs every urination. The problem has been gradually worsening. The quality of the pain is described as aching. The pain is moderate. There has been no fever. Associated symptoms include chills, flank pain, frequency, hematuria and urgency. She has tried acetaminophen for the symptoms. The treatment provided no relief. There is no history of recurrent UTIs.    Lab Results  Component Value Date   NA 136 07/28/2022   K 3.7 07/28/2022   CO2 20 07/28/2022   GLUCOSE 96 07/28/2022   BUN 32 (H) 07/28/2022   CREATININE 1.28 (H) 07/28/2022   CALCIUM 9.8 07/28/2022   EGFR 46 (L) 07/28/2022   GFRNONAA 68 05/22/2020   Lab Results  Component Value Date   CHOL 203 (H) 12/14/2021   HDL 61 12/14/2021   LDLCALC 128 (H) 12/14/2021   TRIG 76 12/14/2021   CHOLHDL 3.7 05/31/2019   No results found for: "TSH" No results found for: "HGBA1C" Lab Results  Component Value Date   WBC 8.7 08/15/2011   HGB 14.3 08/15/2011   HCT 42.6 08/15/2011   MCV 92 08/15/2011   PLT 191 08/15/2011   Lab Results  Component Value Date   ALT 16 12/14/2021   AST 13 12/14/2021   ALKPHOS 92 12/14/2021   BILITOT 0.3 12/14/2021   No results found for: "25OHVITD2", "25OHVITD3", "VD25OH"   Review of Systems  Constitutional:  Positive for chills. Negative for diaphoresis and fever.  Respiratory:  Negative for chest tightness, shortness of breath and wheezing.   Cardiovascular:  Negative for chest pain and leg swelling.  Endocrine: Negative for polydipsia and polyuria.  Genitourinary:  Positive for dysuria, flank pain, frequency, hematuria and urgency. Negative for difficulty urinating, menstrual problem, pelvic pain, vaginal bleeding and vaginal discharge.    Patient Active  Problem List   Diagnosis Date Noted   Essential hypertension 04/20/2016   Hyperlipidemia 04/20/2016   Acute maxillary sinusitis 04/20/2016    Allergies  Allergen Reactions   Sulfa Antibiotics Hives    Other reaction(s): Unknown    Past Surgical History:  Procedure Laterality Date   ABDOMINAL HYSTERECTOMY     BACK SURGERY      Social History   Tobacco Use   Smoking status: Former    Types: Cigarettes    Quit date: 11/13/2018    Years since quitting: 4.0   Smokeless tobacco: Never  Vaping Use   Vaping Use: Never used  Substance Use Topics   Alcohol use: No   Drug use: No     Medication list has been reviewed and updated.  Current Meds  Medication Sig   aspirin 81 MG tablet Take 81 mg by mouth daily.   atorvastatin (LIPITOR) 10 MG tablet TAKE 1 TABLET(10 MG) BY MOUTH DAILY   fluticasone (FLONASE) 50 MCG/ACT nasal spray Place 1 spray into both nostrils daily.   lisinopril-hydrochlorothiazide (ZESTORETIC) 20-25 MG tablet Take 1 tablet by mouth daily.   meclizine (ANTIVERT) 25 MG tablet Take 1 tablet (25 mg total) by mouth 3 (three) times daily as needed for dizziness.   Omega 3 1000 MG CAPS Take 1 capsule (1,000 mg total) by mouth 2 (two) times daily.       11/14/2022  8:57 AM 07/28/2022    1:27 PM 06/16/2022    7:53 AM 12/14/2021    7:58 AM  GAD 7 : Generalized Anxiety Score  Nervous, Anxious, on Edge 0 0 0 0  Control/stop worrying 0 0 0 0  Worry too much - different things 0 0 0 0  Trouble relaxing 0 0 0 0  Restless 0 0 0 0  Easily annoyed or irritable 0 0 0 0  Afraid - awful might happen 0 0 0 0  Total GAD 7 Score 0 0 0 0  Anxiety Difficulty Not difficult at all Not difficult at all Not difficult at all Not difficult at all       11/14/2022    8:56 AM 07/28/2022    1:27 PM 06/16/2022    7:53 AM  Depression screen PHQ 2/9  Decreased Interest 0 0 0  Down, Depressed, Hopeless 0 0 0  PHQ - 2 Score 0 0 0  Altered sleeping 0 0 0  Tired, decreased  energy 0 0 0  Change in appetite 0 0 0  Feeling bad or failure about yourself  0 0 0  Trouble concentrating 0 0 0  Moving slowly or fidgety/restless 0 0 0  Suicidal thoughts 0 0 0  PHQ-9 Score 0 0 0  Difficult doing work/chores Not difficult at all Not difficult at all Not difficult at all    BP Readings from Last 3 Encounters:  11/14/22 (!) 150/108  11/11/22 112/77  07/28/22 120/72    Physical Exam Vitals and nursing note reviewed.  HENT:     Right Ear: Tympanic membrane normal.     Left Ear: Tympanic membrane normal.  Cardiovascular:     Rate and Rhythm: Normal rate and regular rhythm.     Heart sounds: S1 normal and S2 normal. No murmur heard.    No systolic murmur is present.     No diastolic murmur is present.     No friction rub. No gallop. No S3 or S4 sounds.  Pulmonary:     Breath sounds: No wheezing, rhonchi or rales.  Abdominal:     General: Bowel sounds are normal.     Tenderness: There is abdominal tenderness in the suprapubic area. There is no right CVA tenderness, left CVA tenderness, guarding or rebound.  Musculoskeletal:     Cervical back: Neck supple.  Skin:    Findings: No rash.  Neurological:     Mental Status: She is alert.    Wt Readings from Last 3 Encounters:  11/14/22 161 lb (73 kg)  11/11/22 162 lb (73.5 kg)  07/28/22 166 lb (75.3 kg)    BP (!) 150/108 (BP Location: Right Arm, Cuff Size: Large)   Pulse 78   Temp 97.9 F (36.6 C) (Oral)   Ht  (1.651 m)   Wt 161 lb (73 kg)   SpO2 99%   BMI 26.79 kg/m   Assessment and Plan:  1. Dysuria New onset.  Persistent.  Stable.  Continue symptomatic.  Patient was evaluated in urgent care was treated for yeast infection with Diflucan.  Urine culture was sent but was noted to be contaminated with 30,000 colonies of multiple species.  Will recheck urine culture with clean-catch.  - POCT urinalysis dipstick - Urine Culture  2. Cystitis Acute.  Persistent.  Relatively stable.  No CVA  tenderness some mild flank tenderness in the lower right aspect without fever or chills.  There is tenderness over the suprapubic consistent with cystitis we  will treat with Macrobid 100 mg twice a day and await urine cultures patient is to call if symptoms should persist or worsen. - nitrofurantoin, macrocrystal-monohydrate, (MACROBID) 100 MG capsule; Take 1 capsule (100 mg total) by mouth 2 (two) times daily for 7 days.  Dispense: 14 capsule; Refill: 0    Elizabeth Sauer, MD

## 2022-11-16 ENCOUNTER — Telehealth: Payer: Self-pay

## 2022-11-16 ENCOUNTER — Telehealth: Payer: Self-pay | Admitting: Family Medicine

## 2022-11-16 LAB — URINE CULTURE

## 2022-11-16 NOTE — Telephone Encounter (Signed)
Copied from CRM [id number]. Topic: General - Other >> Nov 16, 2022 11:53 AM Everette C wrote: Reason for CRM: The patient has requested to speak with T Lynch when possible regarding previous discussed medication concerns   Please contact further when available

## 2022-11-16 NOTE — Telephone Encounter (Signed)
Called [facility]- they will have patient's remainder of RX ready for pick up tomorrow- Nitrofurantoin

## 2022-11-25 ENCOUNTER — Other Ambulatory Visit: Payer: Self-pay | Admitting: Family Medicine

## 2022-11-25 DIAGNOSIS — E7800 Pure hypercholesterolemia, unspecified: Secondary | ICD-10-CM

## 2022-11-25 NOTE — Telephone Encounter (Signed)
Rx 06/16/22 #90 1RF- too soon Requested Prescriptions  Pending Prescriptions Disp Refills   atorvastatin (LIPITOR) 10 MG tablet [Pharmacy Med Name: ATORVASTATIN 10MG  TABLETS] 90 tablet 1    Sig: TAKE 1 TABLET(10 MG) BY MOUTH DAILY     Cardiovascular:  Antilipid - Statins Failed - 11/25/2022 12:46 PM      Failed - Lipid Panel in normal range within the last 12 months    Cholesterol, Total  Date Value Ref Range Status  12/14/2021 203 (H) 100 - 199 mg/dL Final   LDL Chol Calc (NIH)  Date Value Ref Range Status  12/14/2021 128 (H) 0 - 99 mg/dL Final   HDL  Date Value Ref Range Status  12/14/2021 61 >39 mg/dL Final   Triglycerides  Date Value Ref Range Status  12/14/2021 76 0 - 149 mg/dL Final         Passed - Patient is not pregnant      Passed - Valid encounter within last 12 months    Recent Outpatient Visits           1 week ago Dysuria   [facility]   4 months ago Essential hypertension   [facility]   5 months ago Essential hypertension   [facility]   11 months ago Essential hypertension   [facility] [facility]   1 year ago Essential hypertension   [place] [facility]       Future Appointments             In 2 weeks Duanne Limerick, MD [facility] at [facility], [facility]

## 2022-12-15 ENCOUNTER — Encounter: Payer: Self-pay | Admitting: Family Medicine

## 2022-12-15 ENCOUNTER — Ambulatory Visit (INDEPENDENT_AMBULATORY_CARE_PROVIDER_SITE_OTHER): Payer: Medicare Other | Admitting: Family Medicine

## 2022-12-15 VITALS — BP 118/60 | HR 60 | Ht 65.0 in | Wt 157.0 lb

## 2022-12-15 DIAGNOSIS — H8110 Benign paroxysmal vertigo, unspecified ear: Secondary | ICD-10-CM | POA: Diagnosis not present

## 2022-12-15 DIAGNOSIS — I10 Essential (primary) hypertension: Secondary | ICD-10-CM | POA: Diagnosis not present

## 2022-12-15 DIAGNOSIS — J301 Allergic rhinitis due to pollen: Secondary | ICD-10-CM | POA: Diagnosis not present

## 2022-12-15 DIAGNOSIS — E7800 Pure hypercholesterolemia, unspecified: Secondary | ICD-10-CM | POA: Diagnosis not present

## 2022-12-15 MED ORDER — ATORVASTATIN CALCIUM 10 MG PO TABS
ORAL_TABLET | ORAL | 1 refills | Status: DC
Start: 2022-12-15 — End: 2023-06-19

## 2022-12-15 MED ORDER — LISINOPRIL-HYDROCHLOROTHIAZIDE 20-25 MG PO TABS
1.0000 | ORAL_TABLET | Freq: Every day | ORAL | 1 refills | Status: DC
Start: 2022-12-15 — End: 2023-06-19

## 2022-12-15 MED ORDER — FLUTICASONE PROPIONATE 50 MCG/ACT NA SUSP
1.0000 | Freq: Every day | NASAL | 11 refills | Status: AC
Start: 2022-12-15 — End: ?

## 2022-12-15 MED ORDER — MECLIZINE HCL 25 MG PO TABS
25.0000 mg | ORAL_TABLET | Freq: Three times a day (TID) | ORAL | 1 refills | Status: AC | PRN
Start: 2022-12-15 — End: ?

## 2022-12-15 NOTE — Progress Notes (Signed)
Date:  12/15/2022   Name:  Julie Maldonado   DOB:  1956-03-18   MRN:  [id number]   Chief Complaint: Hyperlipidemia, Hypertension, Allergic Rhinitis , and Dizziness (Takes meclizine for this)  Hyperlipidemia This is a chronic problem. The current episode started more than 1 year ago. The problem is controlled. Recent lipid tests were reviewed and are normal. She has no history of chronic renal disease, diabetes or hypothyroidism. There are no known factors aggravating her hyperlipidemia. Pertinent negatives include no chest pain, focal sensory loss, focal weakness, leg pain, myalgias or shortness of breath. Current antihyperlipidemic treatment includes statins. The current treatment provides moderate improvement of lipids. There are no compliance problems.  There are no known risk factors for coronary artery disease.  Hypertension This is a chronic problem. The current episode started more than 1 year ago. The problem has been gradually improving since onset. The problem is controlled. Pertinent negatives include no chest pain, headaches, orthopnea, palpitations, peripheral edema, PND or shortness of breath. There are no associated agents to hypertension. Risk factors for coronary artery disease include dyslipidemia. Past treatments include ACE inhibitors and diuretics. The current treatment provides moderate improvement. There are no compliance problems.  There is no history of angina, heart failure or left ventricular hypertrophy. There is no history of chronic renal disease, a hypertension causing med or renovascular disease.  Dizziness This is a chronic problem. The current episode started more than 1 year ago. The problem occurs intermittently. The problem has been unchanged. Associated symptoms include vertigo. Pertinent negatives include no abdominal pain, chest pain, coughing, fever, headaches, myalgias or nausea. Associated symptoms comments: Duration 1 minutes. Nothing aggravates the symptoms.  Treatments tried: meclizine. The treatment provided moderate relief.    Lab Results  Component Value Date   NA 136 07/28/2022   K 3.7 07/28/2022   CO2 20 07/28/2022   GLUCOSE 96 07/28/2022   BUN 32 (H) 07/28/2022   CREATININE 1.28 (H) 07/28/2022   CALCIUM 9.8 07/28/2022   EGFR 46 (L) 07/28/2022   GFRNONAA 68 05/22/2020   Lab Results  Component Value Date   CHOL 203 (H) 12/14/2021   HDL 61 12/14/2021   LDLCALC 128 (H) 12/14/2021   TRIG 76 12/14/2021   CHOLHDL 3.7 05/31/2019   No results found for: "TSH" No results found for: "HGBA1C" Lab Results  Component Value Date   WBC 8.7 08/15/2011   HGB 14.3 08/15/2011   HCT 42.6 08/15/2011   MCV 92 08/15/2011   PLT 191 08/15/2011   Lab Results  Component Value Date   ALT 16 12/14/2021   AST 13 12/14/2021   ALKPHOS 92 12/14/2021   BILITOT 0.3 12/14/2021   No results found for: "25OHVITD2", "25OHVITD3", "VD25OH"   Review of Systems  Constitutional:  Negative for fever.  HENT:  Negative for trouble swallowing.   Eyes:  Negative for visual disturbance.  Respiratory:  Negative for cough, chest tightness, shortness of breath and wheezing.   Cardiovascular:  Negative for chest pain, palpitations, orthopnea and PND.  Gastrointestinal:  Negative for abdominal pain and nausea.  Musculoskeletal:  Negative for myalgias.  Skin:  Negative for color change.  Neurological:  Positive for dizziness and vertigo. Negative for focal weakness and headaches.    Patient Active Problem List   Diagnosis Date Noted   Essential hypertension 04/20/2016   Hyperlipidemia 04/20/2016   Acute maxillary sinusitis 04/20/2016    Allergies  Allergen Reactions   Sulfa Antibiotics Hives    Other  reaction(s): Unknown    Past Surgical History:  Procedure Laterality Date   ABDOMINAL HYSTERECTOMY     BACK SURGERY      Social History   Tobacco Use   Smoking status: Former    Types: Cigarettes    Quit date: 11/13/2018    Years since quitting:  4.0   Smokeless tobacco: Never  Vaping Use   Vaping Use: Never used  Substance Use Topics   Alcohol use: No   Drug use: No     Medication list has been reviewed and updated.  Current Meds  Medication Sig   aspirin 81 MG tablet Take 81 mg by mouth daily.   atorvastatin (LIPITOR) 10 MG tablet TAKE 1 TABLET(10 MG) BY MOUTH DAILY   fluticasone (FLONASE) 50 MCG/ACT nasal spray Place 1 spray into both nostrils daily.   lisinopril-hydrochlorothiazide (ZESTORETIC) 20-25 MG tablet Take 1 tablet by mouth daily.   meclizine (ANTIVERT) 25 MG tablet Take 1 tablet (25 mg total) by mouth 3 (three) times daily as needed for dizziness.   Omega 3 1000 MG CAPS Take 1 capsule (1,000 mg total) by mouth 2 (two) times daily.       12/15/2022    8:02 AM 11/14/2022    8:57 AM 07/28/2022    1:27 PM 06/16/2022    7:53 AM  GAD 7 : Generalized Anxiety Score  Nervous, Anxious, on Edge 0 0 0 0  Control/stop worrying 0 0 0 0  Worry too much - different things 0 0 0 0  Trouble relaxing 0 0 0 0  Restless 0 0 0 0  Easily annoyed or irritable 0 0 0 0  Afraid - awful might happen 0 0 0 0  Total GAD 7 Score 0 0 0 0  Anxiety Difficulty Not difficult at all Not difficult at all Not difficult at all Not difficult at all       12/15/2022    8:01 AM 11/14/2022    8:56 AM 07/28/2022    1:27 PM  Depression screen PHQ 2/9  Decreased Interest 0 0 0  Down, Depressed, Hopeless 0 0 0  PHQ - 2 Score 0 0 0  Altered sleeping 0 0 0  Tired, decreased energy 0 0 0  Change in appetite 0 0 0  Feeling bad or failure about yourself  0 0 0  Trouble concentrating 0 0 0  Moving slowly or fidgety/restless 0 0 0  Suicidal thoughts 0 0 0  PHQ-9 Score 0 0 0  Difficult doing work/chores Not difficult at all Not difficult at all Not difficult at all    BP Readings from Last 3 Encounters:  12/15/22 118/60  11/14/22 (!) 150/108  11/11/22 112/77    Physical Exam Vitals and nursing note reviewed. Exam conducted with a  chaperone present.  Constitutional:      General: She is not in acute distress.    Appearance: She is not diaphoretic.  HENT:     Head: Normocephalic and atraumatic.     Right Ear: Tympanic membrane and external ear normal.     Left Ear: Tympanic membrane and external ear normal.     Nose: Nose normal. No congestion or rhinorrhea.  Eyes:     General:        Right eye: No discharge.        Left eye: No discharge.     Conjunctiva/sclera: Conjunctivae normal.     Pupils: Pupils are equal, round, and reactive to light.  Neck:  Thyroid: No thyromegaly.     Vascular: No JVD.  Cardiovascular:     Rate and Rhythm: Normal rate and regular rhythm.     Heart sounds: Normal heart sounds, S1 normal and S2 normal. No murmur heard.    No systolic murmur is present.     No diastolic murmur is present.     No friction rub. No gallop. No S3 or S4 sounds.  Pulmonary:     Effort: Pulmonary effort is normal.     Breath sounds: Normal breath sounds. No wheezing, rhonchi or rales.  Abdominal:     General: Bowel sounds are normal.     Palpations: Abdomen is soft. There is no mass.     Tenderness: There is no abdominal tenderness. There is no guarding.  Musculoskeletal:        General: Normal range of motion.     Cervical back: Normal range of motion and neck supple.  Lymphadenopathy:     Cervical: No cervical adenopathy.  Skin:    General: Skin is warm and dry.  Neurological:     Mental Status: She is alert.     Deep Tendon Reflexes: Reflexes are normal and symmetric.     Wt Readings from Last 3 Encounters:  12/15/22 157 lb (71.2 kg)  11/14/22 161 lb (73 kg)  11/11/22 162 lb (73.5 kg)    BP 118/60   Pulse 60   Ht 5\' 5"  (1.651 m)   Wt 157 lb (71.2 kg)   SpO2 96%   BMI 26.13 kg/m   Assessment and Plan: 1. Essential hypertension Chronic.  Controlled.  Stable.  Blood pressure today 118/60.  Asymptomatic.  Tolerating medications well.  Continue lisinopril hydrochlorothiazide 20-25  mg once a day.  Will check CMP for electrolytes and GFR today.  Will recheck patient in 6 months. - lisinopril-hydrochlorothiazide (ZESTORETIC) 20-25 MG tablet; Take 1 tablet by mouth daily.  Dispense: 90 tablet; Refill: 1 - Comprehensive Metabolic Panel (CMET)  2. Pure hypercholesterolemia Chronic.  Controlled.  Stable.  Continue atorvastatin 10 mg once a day.  Will check lipid panel for surveillance of LDL control.  Will recheck in 6 months. - atorvastatin (LIPITOR) 10 MG tablet; TAKE 1 TABLET(10 MG) BY MOUTH DAILY  Dispense: 90 tablet; Refill: 1 - Lipid Panel With LDL/HDL Ratio  3. Seasonal allergic rhinitis due to pollen Chronic.  Episodic.  Stable.  Continue fluticasone nasal spray 1 spray both nostril daily. - fluticasone (FLONASE) 50 MCG/ACT nasal spray; Place 1 spray into both nostrils daily.  Dispense: 16 g; Refill: 11  4. Benign paroxysmal positional vertigo, unspecified laterality Chronic.  Controlled.  Episodic.  Initiated by turning a certain way last less than a minute if it goes beyond that patient has access to meclizine 25 mg on as-needed basis. - meclizine (ANTIVERT) 25 MG tablet; Take 1 tablet (25 mg total) by mouth 3 (three) times daily as needed for dizziness.  Dispense: 30 tablet; Refill: 1     Elizabeth Sauer, MD

## 2022-12-16 LAB — LIPID PANEL WITH LDL/HDL RATIO
Cholesterol, Total: 172 mg/dL (ref 100–199)
HDL: 45 mg/dL (ref >39)
LDL Chol Calc (NIH): 107 mg/dL — ABNORMAL HIGH (ref 0–99)
LDL/HDL Ratio: 2.4 ratio (ref 0.0–3.2)
Triglycerides: 110 mg/dL (ref 0–149)
VLDL Cholesterol Cal: 20 mg/dL (ref 5–40)

## 2022-12-16 LAB — COMPREHENSIVE METABOLIC PANEL
ALT: 10 IU/L (ref 0–32)
AST: 12 IU/L (ref 0–40)
Albumin/Globulin Ratio: 2 (ref 1.2–2.2)
Albumin: 4.6 g/dL (ref 3.9–4.9)
Alkaline Phosphatase: 75 IU/L (ref 44–121)
BUN/Creatinine Ratio: 17 (ref 12–28)
BUN: 26 mg/dL (ref 8–27)
Bilirubin Total: 0.2 mg/dL (ref 0.0–1.2)
CO2: 23 mmol/L (ref 20–29)
Calcium: 9.6 mg/dL (ref 8.7–10.3)
Chloride: 99 mmol/L (ref 96–106)
Creatinine, Ser: 1.56 mg/dL — ABNORMAL HIGH (ref 0.57–1.00)
Globulin, Total: 2.3 g/dL (ref 1.5–4.5)
Glucose: 115 mg/dL — ABNORMAL HIGH (ref 70–99)
Potassium: 3.4 mmol/L — ABNORMAL LOW (ref 3.5–5.2)
Sodium: 138 mmol/L (ref 134–144)
Total Protein: 6.9 g/dL (ref 6.0–8.5)
eGFR: 36 mL/min/{1.73_m2} — ABNORMAL LOW (ref >59)

## 2022-12-19 ENCOUNTER — Other Ambulatory Visit: Payer: Self-pay

## 2022-12-19 DIAGNOSIS — I10 Essential (primary) hypertension: Secondary | ICD-10-CM

## 2022-12-19 DIAGNOSIS — E7800 Pure hypercholesterolemia, unspecified: Secondary | ICD-10-CM | POA: Diagnosis not present

## 2022-12-19 NOTE — Progress Notes (Signed)
Printed labs

## 2022-12-20 LAB — COMPREHENSIVE METABOLIC PANEL
ALT: 11 IU/L (ref 0–32)
AST: 15 IU/L (ref 0–40)
Albumin/Globulin Ratio: 1.9 (ref 1.2–2.2)
Albumin: 4.6 g/dL (ref 3.9–4.9)
Alkaline Phosphatase: 74 IU/L (ref 44–121)
BUN/Creatinine Ratio: 19 (ref 12–28)
BUN: 21 mg/dL (ref 8–27)
Bilirubin Total: 0.2 mg/dL (ref 0.0–1.2)
CO2: 21 mmol/L (ref 20–29)
Calcium: 9.7 mg/dL (ref 8.7–10.3)
Chloride: 93 mmol/L — ABNORMAL LOW (ref 96–106)
Creatinine, Ser: 1.12 mg/dL — ABNORMAL HIGH (ref 0.57–1.00)
Globulin, Total: 2.4 g/dL (ref 1.5–4.5)
Glucose: 115 mg/dL — ABNORMAL HIGH (ref 70–99)
Potassium: 3.8 mmol/L (ref 3.5–5.2)
Sodium: 132 mmol/L — ABNORMAL LOW (ref 134–144)
Total Protein: 7 g/dL (ref 6.0–8.5)
eGFR: 54 mL/min/{1.73_m2} — ABNORMAL LOW (ref >59)

## 2022-12-20 LAB — LIPID PANEL WITH LDL/HDL RATIO
Cholesterol, Total: 181 mg/dL (ref 100–199)
HDL: 48 mg/dL (ref >39)
LDL Chol Calc (NIH): 114 mg/dL — ABNORMAL HIGH (ref 0–99)
LDL/HDL Ratio: 2.4 ratio (ref 0.0–3.2)
Triglycerides: 103 mg/dL (ref 0–149)
VLDL Cholesterol Cal: 19 mg/dL (ref 5–40)

## 2023-03-07 ENCOUNTER — Ambulatory Visit (INDEPENDENT_AMBULATORY_CARE_PROVIDER_SITE_OTHER): Payer: Medicare Other

## 2023-03-07 DIAGNOSIS — Z0000 Encounter for general adult medical examination without abnormal findings: Secondary | ICD-10-CM

## 2023-03-07 NOTE — Patient Instructions (Addendum)
Julie Maldonado , Thank you for taking time to come for your Medicare Wellness Visit. I appreciate your ongoing commitment to your health goals. Please review the following plan we discussed and let me know if I can assist you in the future.   Referrals/Orders/Follow-Ups/Clinician Recommendations: none  This is a list of the screening recommended for you and due dates:  Health Maintenance  Topic Date Due   DEXA scan (bone density measurement)  Never done   Mammogram  12/16/2022   Flu Shot  03/02/2023   Zoster (Shingles) Vaccine (1 of 2) 03/17/2023*   Colon Cancer Screening  06/17/2023*   Medicare Annual Wellness Visit  03/06/2024   DTaP/Tdap/Td vaccine (2 - Td or Tdap) 03/23/2027   Pneumonia Vaccine  Completed   Hepatitis C Screening  Completed   HPV Vaccine  Aged Out   COVID-19 Vaccine  Discontinued  *Topic was postponed. The date shown is not the original due date.    Advanced directives: (ACP Link)Information on Advanced Care Planning can be found at [facility] Directives Advance Health Care Directives ([email or web address]   Next Medicare Annual Wellness Visit scheduled for next year: Yes   03/12/24 @ 8:45 am by phone  Preventive Care 65 Years and Older, Female Preventive care refers to lifestyle choices and visits with your health care provider that can promote health and wellness. What does preventive care include? A yearly physical exam. This is also called an annual well check. Dental exams once or twice a year. Routine eye exams. Ask your health care provider how often you should have your eyes checked. Personal lifestyle choices, including: Daily care of your teeth and gums. Regular physical activity. Eating a healthy diet. Avoiding tobacco and drug use. Limiting alcohol use. Practicing safe sex. Taking low-dose aspirin every day. Taking vitamin and mineral supplements as recommended by your health care provider. What happens during an  annual well check? The services and screenings done by your health care provider during your annual well check will depend on your age, overall health, lifestyle risk factors, and family history of disease. Counseling  Your health care provider may ask you questions about your: Alcohol use. Tobacco use. Drug use. Emotional well-being. Home and relationship well-being. Sexual activity. Eating habits. History of falls. Memory and ability to understand (cognition). Work and work Astronomer. Reproductive health. Screening  You may have the following tests or measurements: Height, weight, and BMI. Blood pressure. Lipid and cholesterol levels. These may be checked every 5 years, or more frequently if you are over 53 years old. Skin check. Lung cancer screening. You may have this screening every year starting at age 25 if you have a 30-pack-year history of smoking and currently smoke or have quit within the past 15 years. Fecal occult blood test (FOBT) of the stool. You may have this test every year starting at age 62. Flexible sigmoidoscopy or colonoscopy. You may have a sigmoidoscopy every 5 years or a colonoscopy every 10 years starting at age 40. Hepatitis C blood test. Hepatitis B blood test. Sexually transmitted disease (STD) testing. Diabetes screening. This is done by checking your blood sugar (glucose) after you have not eaten for a while (fasting). You may have this done every 1-3 years. Bone density scan. This is done to screen for osteoporosis. You may have this done starting at age 11. Mammogram. This may be done every 1-2 years. Talk to your health care provider about how often you should have regular  mammograms. Talk with your health care provider about your test results, treatment options, and if necessary, the need for more tests. Vaccines  Your health care provider may recommend certain vaccines, such as: Influenza vaccine. This is recommended every year. Tetanus,  diphtheria, and acellular pertussis (Tdap, Td) vaccine. You may need a Td booster every 10 years. Zoster vaccine. You may need this after age 12. Pneumococcal 13-valent conjugate (PCV13) vaccine. One dose is recommended after age 72. Pneumococcal polysaccharide (PPSV23) vaccine. One dose is recommended after age 10. Talk to your health care provider about which screenings and vaccines you need and how often you need them. This information is not intended to replace advice given to you by your health care provider. Make sure you discuss any questions you have with your health care provider. Document Released: 08/14/2015 Document Revised: 04/06/2016 Document Reviewed: 05/19/2015 Elsevier Interactive Patient Education  2017 [employer].  Fall Prevention in the Home Falls can cause injuries. They can happen to people of all ages. There are many things you can do to make your home safe and to help prevent falls. What can I do on the outside of my home? Regularly fix the edges of walkways and driveways and fix any cracks. Remove anything that might make you trip as you walk through a door, such as a raised step or threshold. Trim any bushes or trees on the path to your home. Use bright outdoor lighting. Clear any walking paths of anything that might make someone trip, such as rocks or tools. Regularly check to see if handrails are loose or broken. Make sure that both sides of any steps have handrails. Any raised decks and porches should have guardrails on the edges. Have any leaves, snow, or ice cleared regularly. Use sand or salt on walking paths during winter. Clean up any spills in your garage right away. This includes oil or grease spills. What can I do in the bathroom? Use night lights. Install grab bars by the toilet and in the tub and shower. Do not use towel bars as grab bars. Use non-skid mats or decals in the tub or shower. If you need to sit down in the shower, use a plastic,  non-slip stool. Keep the floor dry. Clean up any water that spills on the floor as soon as it happens. Remove soap buildup in the tub or shower regularly. Attach bath mats securely with double-sided non-slip rug tape. Do not have throw rugs and other things on the floor that can make you trip. What can I do in the bedroom? Use night lights. Make sure that you have a light by your bed that is easy to reach. Do not use any sheets or blankets that are too big for your bed. They should not hang down onto the floor. Have a firm chair that has side arms. You can use this for support while you get dressed. Do not have throw rugs and other things on the floor that can make you trip. What can I do in the kitchen? Clean up any spills right away. Avoid walking on wet floors. Keep items that you use a lot in easy-to-reach places. If you need to reach something above you, use a strong step stool that has a grab bar. Keep electrical cords out of the way. Do not use floor polish or wax that makes floors slippery. If you must use wax, use non-skid floor wax. Do not have throw rugs and other things on the floor that can  make you trip. What can I do with my stairs? Do not leave any items on the stairs. Make sure that there are handrails on both sides of the stairs and use them. Fix handrails that are broken or loose. Make sure that handrails are as long as the stairways. Check any carpeting to make sure that it is firmly attached to the stairs. Fix any carpet that is loose or worn. Avoid having throw rugs at the top or bottom of the stairs. If you do have throw rugs, attach them to the floor with carpet tape. Make sure that you have a light switch at the top of the stairs and the bottom of the stairs. If you do not have them, ask someone to add them for you. What else can I do to help prevent falls? Wear shoes that: Do not have high heels. Have rubber bottoms. Are comfortable and fit you well. Are closed  at the toe. Do not wear sandals. If you use a stepladder: Make sure that it is fully opened. Do not climb a closed stepladder. Make sure that both sides of the stepladder are locked into place. Ask someone to hold it for you, if possible. Clearly mark and make sure that you can see: Any grab bars or handrails. First and last steps. Where the edge of each step is. Use tools that help you move around (mobility aids) if they are needed. These include: Canes. Walkers. Scooters. Crutches. Turn on the lights when you go into a dark area. Replace any light bulbs as soon as they burn out. Set up your furniture so you have a clear path. Avoid moving your furniture around. If any of your floors are uneven, fix them. If there are any pets around you, be aware of where they are. Review your medicines with your doctor. Some medicines can make you feel dizzy. This can increase your chance of falling. Ask your doctor what other things that you can do to help prevent falls. This information is not intended to replace advice given to you by your health care provider. Make sure you discuss any questions you have with your health care provider. Document Released: 05/14/2009 Document Revised: 12/24/2015 Document Reviewed: 08/22/2014 Elsevier Interactive Patient Education  2017 [employer].

## 2023-03-07 NOTE — Progress Notes (Signed)
Subjective:   Julie Maldonado is a 67 y.o. female who presents for Medicare Annual (Subsequent) preventive examination.  Visit Complete: Virtual  I connected with  Julie Maldonado on 03/07/23 by a audio enabled telemedicine application and verified that I am speaking with the correct person using two identifiers.  Patient Location: Home  Provider Location: Office/Clinic  I discussed the limitations of evaluation and management by telemedicine. The patient expressed understanding and agreed to proceed.  Vital Signs: Patient was unable to self-report vital signs via telehealth due to a lack of equipment at home.  Review of Systems     Cardiac Risk Factors include: advanced age (>67men, >59 women);dyslipidemia;hypertension     Objective:    There were no vitals filed for this visit. There is no height or weight on file to calculate BMI.     03/07/2023    9:49 AM 11/11/2022    5:15 PM 02/05/2022    8:15 AM 03/23/2020    6:05 PM 05/27/2017    9:25 AM 12/02/2016    3:39 PM  Advanced Directives  Does Patient Have a Medical Advance Directive? No Yes Yes No No Yes  Type of Science writer of [place];Living will   Living will  Would patient like information on creating a medical advance directive? No - Patient declined         Current Medications (verified) Outpatient Encounter Medications as of 03/07/2023  Medication Sig   aspirin 81 MG tablet Take 81 mg by mouth daily.   atorvastatin (LIPITOR) 10 MG tablet TAKE 1 TABLET(10 MG) BY MOUTH DAILY   fluticasone (FLONASE) 50 MCG/ACT nasal spray Place 1 spray into both nostrils daily.   lisinopril-hydrochlorothiazide (ZESTORETIC) 20-25 MG tablet Take 1 tablet by mouth daily.   meclizine (ANTIVERT) 25 MG tablet Take 1 tablet (25 mg total) by mouth 3 (three) times daily as needed for dizziness.   Omega 3 1000 MG CAPS Take 1 capsule (1,000 mg total) by mouth 2 (two) times daily.   No  facility-administered encounter medications on file as of 03/07/2023.    Allergies (verified) Sulfa antibiotics   History: Past Medical History:  Diagnosis Date   Hyperlipemia    Hypertension    Insomnia    Past Surgical History:  Procedure Laterality Date   ABDOMINAL HYSTERECTOMY     BACK SURGERY     Family History  Problem Relation Age of Onset   Cancer Mother    Breast cancer Mother 91   Diabetes Maternal Grandfather    Stroke Maternal Grandfather    Social History   Socioeconomic History   Marital status: Divorced    Spouse name: Not on file   Number of children: 3   Years of education: Not on file   Highest education level: Not on file  Occupational History   Occupation: Part time  Tobacco Use   Smoking status: Former    Current packs/day: 0.00    Types: Cigarettes    Quit date: 11/13/2018    Years since quitting: 4.3   Smokeless tobacco: Never  Vaping Use   Vaping status: Never Used  Substance and Sexual Activity   Alcohol use: No   Drug use: No   Sexual activity: Never  Other Topics Concern   Not on file  Social History Narrative   Not on file   Social Determinants of Health   Financial Resource Strain: Low Risk  (03/07/2023)   Overall Financial Resource  Strain (CARDIA)    Difficulty of Paying Living Expenses: Not hard at all  Food Insecurity: No Food Insecurity (03/07/2023)   Hunger Vital Sign    Worried About Running Out of Food in the Last Year: Never true    Ran Out of Food in the Last Year: Never true  Transportation Needs: No Transportation Needs (03/07/2023)   PRAPARE - Administrator, Civil Service (Medical): No    Lack of Transportation (Non-Medical): No  Physical Activity: Sufficiently Active (03/07/2023)   Exercise Vital Sign    Days of Exercise per Week: 3 days    Minutes of Exercise per Session: 60 min  Stress: No Stress Concern Present (03/07/2023)   [employer] of Occupational Health - Occupational Stress Questionnaire     Feeling of Stress : Not at all  Social Connections: Socially Isolated (03/07/2023)   Social Connection and Isolation Panel [NHANES]    Frequency of Communication with Friends and Family: More than three times a week    Frequency of Social Gatherings with Friends and Family: Twice a week    Attends Religious Services: Never    Database administrator or Organizations: No    Attends Engineer, structural: Never    Marital Status: Divorced    Tobacco Counseling Counseling given: Not Answered   Clinical Intake:  Pre-visit preparation completed: Yes  Pain : No/denies pain     Nutritional Risks: None Diabetes: No  How often do you need to have someone help you when you read instructions, pamphlets, or other written materials from your doctor or pharmacy?: 1 - Never  Interpreter Needed?: No  Information entered by :: Kennedy Bucker, LPN   Activities of Daily Living    03/07/2023    9:49 AM  In your present state of health, do you have any difficulty performing the following activities:  Hearing? 0  Vision? 0  Difficulty concentrating or making decisions? 0  Walking or climbing stairs? 0  Dressing or bathing? 0  Doing errands, shopping? 0  Preparing Food and eating ? N  Using the Toilet? N  In the past six months, have you accidently leaked urine? N  Do you have problems with loss of bowel control? N  Managing your Medications? N  Managing your Finances? N  Housekeeping or managing your Housekeeping? N    Patient Care Team: Duanne Limerick, MD as PCP - General (Family Medicine)  Indicate any recent Medical Services you may have received from other than [facility] providers in the past year (date may be approximate).     Assessment:   This is a routine wellness examination for Julie Maldonado.  Hearing/Vision screen Hearing Screening - Comments:: No aids Vision Screening - Comments:: Wears glasses- Dr.Shade  Dietary issues and exercise activities discussed:      Goals Addressed             This Visit's Progress    DIET - EAT MORE FRUITS AND VEGETABLES         Depression Screen    03/07/2023    9:47 AM 12/15/2022    8:01 AM 11/14/2022    8:56 AM 07/28/2022    1:27 PM 06/16/2022    7:53 AM 02/14/2022    1:09 PM 12/14/2021    7:58 AM  PHQ 2/9 Scores  PHQ - 2 Score 0 0 0 0 0 0 0  PHQ- 9 Score 0 0 0 0 0  0    Fall Risk  03/07/2023    9:49 AM 12/15/2022    8:01 AM 11/14/2022    8:56 AM 07/28/2022    1:27 PM 06/16/2022    7:53 AM  Fall Risk   Falls in the past year? 0 0 0 0 0  Number falls in past yr: 0 0 0 0 0  Injury with Fall? 0 0 0 0 0  Risk for fall due to : No Fall Risks No Fall Risks No Fall Risks No Fall Risks No Fall Risks  Follow up Falls prevention discussed;Falls evaluation completed Falls evaluation completed Falls evaluation completed Falls evaluation completed Falls evaluation completed    MEDICARE RISK AT HOME:  Medicare Risk at Home - 03/07/23 0950     Any stairs in or around the home? No    If so, are there any without handrails? No    Home free of loose throw rugs in walkways, pet beds, electrical cords, etc? Yes    Adequate lighting in your home to reduce risk of falls? Yes    Life alert? No    Use of a cane, walker or w/c? No    Grab bars in the bathroom? Yes    Shower chair or bench in shower? Yes    Elevated toilet seat or a handicapped toilet? Yes             TIMED UP AND GO:  Was the test performed?  No    Cognitive Function:        03/07/2023    9:50 AM 02/14/2022    1:06 PM  6CIT Screen  What Year? 0 points 0 points  What month? 0 points 0 points  What time? 0 points 0 points  Count back from 20 0 points 0 points  Months in reverse 0 points 0 points  Repeat phrase 0 points 0 points  Total Score 0 points 0 points    Immunizations Immunization History  Administered Date(s) Administered   Fluad Quad(high Dose 65+) 06/14/2021, 06/16/2022   Influenza,inj,Quad PF,6+ Mos 05/22/2018,  05/31/2019   Influenza-Unspecified 05/27/2017, 05/31/2019, 05/15/2020   PNEUMOCOCCAL CONJUGATE-20 06/16/2022   PPD Test 11/22/2013, 11/25/2013, 12/24/2014, 01/10/2016   Pneumococcal Conjugate-13 12/10/2020   Pneumococcal Polysaccharide-23 11/20/2017   Tdap 03/22/2017    TDAP status: Up to date  Flu Vaccine status: Up to date  Pneumococcal vaccine status: Up to date  Covid-19 vaccine status: Declined, Education has been provided regarding the importance of this vaccine but patient still declined. Advised may receive this vaccine at local pharmacy or Health Dept.or vaccine clinic. Aware to provide a copy of the vaccination record if obtained from local pharmacy or Health Dept. Verbalized acceptance and understanding.  Qualifies for Shingles Vaccine? Yes   Zostavax completed No   Shingrix Completed?: No.    Education has been provided regarding the importance of this vaccine. Patient has been advised to call insurance company to determine out of pocket expense if they have not yet received this vaccine. Advised may also receive vaccine at local pharmacy or Health Dept. Verbalized acceptance and understanding.  Screening Tests Health Maintenance  Topic Date Due   DEXA SCAN  Never done   MAMMOGRAM  12/16/2022   INFLUENZA VACCINE  03/02/2023   Zoster Vaccines- Shingrix (1 of 2) 03/17/2023 (Originally 05/07/2005)   Colonoscopy  06/17/2023 (Originally 05/07/2000)   Medicare Annual Wellness (AWV)  03/06/2024   DTaP/Tdap/Td (2 - Td or Tdap) 03/23/2027   Pneumonia Vaccine 48+ Years old  Completed   Hepatitis C  Screening  Completed   HPV VACCINES  Aged Out   COVID-19 Vaccine  Discontinued    Health Maintenance  Health Maintenance Due  Topic Date Due   DEXA SCAN  Never done   MAMMOGRAM  12/16/2022   INFLUENZA VACCINE  03/02/2023    Declined referral for colonoscopy & mammogram & BDS  Mammogram status: Completed 12/15/20. Repeat every year-declined referral   Lung Cancer Screening:  (Low Dose CT Chest recommended if Age 7-80 years, 20 pack-year currently smoking OR have quit w/in 15years.) does not qualify.    Additional Screening:  Hepatitis C Screening: does qualify; Completed 03/22/17  Vision Screening: Recommended annual ophthalmology exams for early detection of glaucoma and other disorders of the eye. Is the patient up to date with their annual eye exam?  Yes  Who is the provider or what is the name of the office in which the patient attends annual eye exams? Dr.Shade If pt is not established with a provider, would they like to be referred to a provider to establish care? No .   Dental Screening: Recommended annual dental exams for proper oral hygiene    Community Resource Referral / Chronic Care Management: CRR required this visit?  No   CCM required this visit?  No     Plan:     I have personally reviewed and noted the following in the patient's chart:   Medical and social history Use of alcohol, tobacco or illicit drugs  Current medications and supplements including opioid prescriptions. Patient is not currently taking opioid prescriptions. Functional ability and status Nutritional status Physical activity Advanced directives List of other physicians Hospitalizations, surgeries, and ER visits in previous 12 months Vitals Screenings to include cognitive, depression, and falls Referrals and appointments  In addition, I have reviewed and discussed with patient certain preventive protocols, quality metrics, and best practice recommendations. A written personalized care plan for preventive services as well as general preventive health recommendations were provided to patient.     Hal Hope, LPN   4/0/9811   After Visit Summary: (MyChart) Due to this being a telephonic visit, the after visit summary with patients personalized plan was offered to patient via MyChart   Nurse Notes: none

## 2023-05-22 ENCOUNTER — Encounter: Payer: Self-pay | Admitting: Pharmacist

## 2023-05-22 ENCOUNTER — Telehealth: Payer: Self-pay | Admitting: Pharmacist

## 2023-05-22 NOTE — Telephone Encounter (Signed)
This encounter was created in error - please disregard.

## 2023-05-22 NOTE — Progress Notes (Signed)
   05/22/2023  Patient ID: Julie Maldonado, female   DOB: April 12, 1956, 67 y.o.   MRN: [id number]  Pharmacy Quality Measure Review   This patient is appearing on a report for the adherence measure for cholesterol (statin) medications this calendar year.   Medication: atorvastatin 10 mg Last fill date: 02/20/2023 for 90 day supply  Receive call back from patient.  Denies barriers to taking her atorvastatin. Reports that she is just about due for refill of both her atorvastatin and lisinopril/HCTZ prescriptions and plans to contact pharmacy for refills of both.  No further action needed.  Estelle Grumbles, PharmD, [facility] [phone]

## 2023-05-22 NOTE — Progress Notes (Signed)
   05/22/2023  Patient ID: Julie Maldonado, female   DOB: 1955-10-27, 67 y.o.   MRN: [id number]  Pharmacy Quality Measure Review  This patient is appearing on a report for the adherence measure for cholesterol (statin) medications this calendar year.   Medication: atorvastatin 10 mg Last fill date: 02/20/2023 for 90 day supply  Left voicemail for patient to return my call at their convenience.  Estelle Grumbles, PharmD, [facility] [phone]

## 2023-06-19 ENCOUNTER — Encounter: Payer: Self-pay | Admitting: Family Medicine

## 2023-06-19 ENCOUNTER — Ambulatory Visit (INDEPENDENT_AMBULATORY_CARE_PROVIDER_SITE_OTHER): Payer: Medicare Other | Admitting: Family Medicine

## 2023-06-19 VITALS — BP 118/60 | HR 68 | Ht 65.0 in | Wt 158.0 lb

## 2023-06-19 DIAGNOSIS — E7800 Pure hypercholesterolemia, unspecified: Secondary | ICD-10-CM | POA: Diagnosis not present

## 2023-06-19 DIAGNOSIS — Z23 Encounter for immunization: Secondary | ICD-10-CM | POA: Diagnosis not present

## 2023-06-19 DIAGNOSIS — I10 Essential (primary) hypertension: Secondary | ICD-10-CM | POA: Diagnosis not present

## 2023-06-19 MED ORDER — LISINOPRIL-HYDROCHLOROTHIAZIDE 20-25 MG PO TABS
1.0000 | ORAL_TABLET | Freq: Every day | ORAL | 1 refills | Status: AC
Start: 2023-06-19 — End: ?

## 2023-06-19 MED ORDER — ATORVASTATIN CALCIUM 10 MG PO TABS
ORAL_TABLET | ORAL | 1 refills | Status: AC
Start: 2023-06-19 — End: ?

## 2023-06-19 NOTE — Progress Notes (Signed)
Date:  06/19/2023   Name:  Julie Maldonado   DOB:  07/22/1956   MRN:  [id number]   Chief Complaint: Hypertension and Hyperlipidemia  Hypertension This is a chronic problem. The current episode started more than 1 year ago. The problem has been gradually improving since onset. The problem is controlled. Pertinent negatives include no anxiety, blurred vision, chest pain, headaches, malaise/fatigue, neck pain, orthopnea, palpitations, peripheral edema, PND, shortness of breath or sweats. There are no associated agents to hypertension. Risk factors for coronary artery disease include dyslipidemia. Past treatments include ACE inhibitors and diuretics. The current treatment provides moderate improvement. There are no compliance problems.  There is no history of angina, CAD/MI or CVA. There is no history of chronic renal disease, a hypertension causing med or renovascular disease.  Hyperlipidemia This is a chronic problem. The current episode started more than 1 year ago. The problem is controlled. Recent lipid tests were reviewed and are normal. She has no history of chronic renal disease. Pertinent negatives include no chest pain, myalgias or shortness of breath. Current antihyperlipidemic treatment includes statins. The current treatment provides moderate improvement of lipids. There are no compliance problems.     Lab Results  Component Value Date   NA 132 (L) 12/19/2022   K 3.8 12/19/2022   CO2 21 12/19/2022   GLUCOSE 115 (H) 12/19/2022   BUN 21 12/19/2022   CREATININE 1.12 (H) 12/19/2022   CALCIUM 9.7 12/19/2022   EGFR 54 (L) 12/19/2022   GFRNONAA 68 05/22/2020   Lab Results  Component Value Date   CHOL 181 12/19/2022   HDL 48 12/19/2022   LDLCALC 114 (H) 12/19/2022   TRIG 103 12/19/2022   CHOLHDL 3.7 05/31/2019   No results found for: "TSH" No results found for: "HGBA1C" Lab Results  Component Value Date   WBC 8.7 08/15/2011   HGB 14.3 08/15/2011   HCT 42.6 08/15/2011   MCV  92 08/15/2011   PLT 191 08/15/2011   Lab Results  Component Value Date   ALT 11 12/19/2022   AST 15 12/19/2022   ALKPHOS 74 12/19/2022   BILITOT 0.2 12/19/2022   No results found for: "25OHVITD2", "25OHVITD3", "VD25OH"   Review of Systems  Constitutional: Negative.  Negative for chills, fatigue, fever, malaise/fatigue and unexpected weight change.  HENT:  Negative for congestion, ear discharge, ear pain, rhinorrhea, sinus pressure, sneezing and sore throat.   Eyes:  Negative for blurred vision.  Respiratory:  Negative for cough, shortness of breath, wheezing and stridor.   Cardiovascular:  Negative for chest pain, palpitations, orthopnea and PND.  Gastrointestinal:  Negative for abdominal pain, blood in stool, constipation, diarrhea and nausea.  Genitourinary:  Negative for dysuria, flank pain, frequency, hematuria, urgency and vaginal discharge.  Musculoskeletal:  Negative for arthralgias, back pain, myalgias and neck pain.  Skin:  Negative for rash.  Neurological:  Negative for dizziness, weakness and headaches.  Hematological:  Negative for adenopathy. Does not bruise/bleed easily.  Psychiatric/Behavioral:  Negative for dysphoric mood. The patient is not nervous/anxious.     Patient Active Problem List   Diagnosis Date Noted   Essential hypertension 04/20/2016   Hyperlipidemia 04/20/2016   Acute maxillary sinusitis 04/20/2016    Allergies  Allergen Reactions   Sulfa Antibiotics Hives    Other reaction(s): Unknown    Past Surgical History:  Procedure Laterality Date   ABDOMINAL HYSTERECTOMY     BACK SURGERY      Social History   Tobacco Use  Smoking status: Former    Current packs/day: 0.00    Types: Cigarettes    Quit date: 11/13/2018    Years since quitting: 4.6   Smokeless tobacco: Never  Vaping Use   Vaping status: Never Used  Substance Use Topics   Alcohol use: No   Drug use: No     Medication list has been reviewed and updated.  Current Meds   Medication Sig   aspirin 81 MG tablet Take 81 mg by mouth daily.   atorvastatin (LIPITOR) 10 MG tablet TAKE 1 TABLET(10 MG) BY MOUTH DAILY   fluticasone (FLONASE) 50 MCG/ACT nasal spray Place 1 spray into both nostrils daily.   lisinopril-hydrochlorothiazide (ZESTORETIC) 20-25 MG tablet Take 1 tablet by mouth daily.   meclizine (ANTIVERT) 25 MG tablet Take 1 tablet (25 mg total) by mouth 3 (three) times daily as needed for dizziness.   Omega 3 1000 MG CAPS Take 1 capsule (1,000 mg total) by mouth 2 (two) times daily.       06/19/2023    8:17 AM 12/15/2022    8:02 AM 11/14/2022    8:57 AM 07/28/2022    1:27 PM  GAD 7 : Generalized Anxiety Score  Nervous, Anxious, on Edge 0 0 0 0  Control/stop worrying 0 0 0 0  Worry too much - different things 0 0 0 0  Trouble relaxing 0 0 0 0  Restless 0 0 0 0  Easily annoyed or irritable 0 0 0 0  Afraid - awful might happen 0 0 0 0  Total GAD 7 Score 0 0 0 0  Anxiety Difficulty Not difficult at all Not difficult at all Not difficult at all Not difficult at all       06/19/2023    8:17 AM 03/07/2023    9:47 AM 12/15/2022    8:01 AM  Depression screen PHQ 2/9  Decreased Interest 0 0 0  Down, Depressed, Hopeless 0 0 0  PHQ - 2 Score 0 0 0  Altered sleeping 0 0 0  Tired, decreased energy 0 0 0  Change in appetite 0 0 0  Feeling bad or failure about yourself  0 0 0  Trouble concentrating 0 0 0  Moving slowly or fidgety/restless 0 0 0  Suicidal thoughts 0 0 0  PHQ-9 Score 0 0 0  Difficult doing work/chores Not difficult at all Not difficult at all Not difficult at all    BP Readings from Last 3 Encounters:  06/19/23 118/60  12/15/22 118/60  11/14/22 (!) 150/108    Physical Exam Vitals and nursing note reviewed. Exam conducted with a chaperone present.  Constitutional:      General: She is not in acute distress.    Appearance: She is not diaphoretic.  HENT:     Head: Normocephalic and atraumatic.     Right Ear: Tympanic membrane and  external ear normal.     Left Ear: Tympanic membrane and external ear normal.     Nose: Nose normal. No congestion or rhinorrhea.     Mouth/Throat:     Mouth: Mucous membranes are moist.  Eyes:     General:        Right eye: No discharge.        Left eye: No discharge.     Conjunctiva/sclera: Conjunctivae normal.     Pupils: Pupils are equal, round, and reactive to light.  Neck:     Thyroid: No thyromegaly.     Vascular: No JVD.  Cardiovascular:  Rate and Rhythm: Normal rate and regular rhythm.     Heart sounds: Normal heart sounds. No murmur heard.    No friction rub. No gallop.  Pulmonary:     Effort: Pulmonary effort is normal.     Breath sounds: Normal breath sounds. No wheezing, rhonchi or rales.  Abdominal:     General: Bowel sounds are normal.     Palpations: Abdomen is soft. There is no mass.     Tenderness: There is no abdominal tenderness. There is no guarding.  Musculoskeletal:        General: Normal range of motion.     Cervical back: Normal range of motion and neck supple.  Lymphadenopathy:     Cervical: No cervical adenopathy.  Skin:    General: Skin is warm and dry.  Neurological:     Mental Status: She is alert.     Deep Tendon Reflexes: Reflexes are normal and symmetric.     Wt Readings from Last 3 Encounters:  06/19/23 158 lb (71.7 kg)  12/15/22 157 lb (71.2 kg)  11/14/22 161 lb (73 kg)    BP 118/60   Pulse 68   Ht 5\' 5"  (1.651 m)   Wt 158 lb (71.7 kg)   SpO2 98%   BMI 26.29 kg/m   Assessment and Plan:     Elizabeth Sauer, MD

## 2023-06-20 ENCOUNTER — Encounter: Payer: Self-pay | Admitting: Family Medicine

## 2023-06-20 LAB — RENAL FUNCTION PANEL
Albumin: 4.3 g/dL (ref 3.9–4.9)
BUN/Creatinine Ratio: 16 (ref 12–28)
BUN: 21 mg/dL (ref 8–27)
CO2: 24 mmol/L (ref 20–29)
Calcium: 10.2 mg/dL (ref 8.7–10.3)
Chloride: 101 mmol/L (ref 96–106)
Creatinine, Ser: 1.35 mg/dL — ABNORMAL HIGH (ref 0.57–1.00)
Glucose: 110 mg/dL — ABNORMAL HIGH (ref 70–99)
Phosphorus: 3.5 mg/dL (ref 3.0–4.3)
Potassium: 4 mmol/L (ref 3.5–5.2)
Sodium: 140 mmol/L (ref 134–144)
eGFR: 43 mL/min/{1.73_m2} — ABNORMAL LOW (ref >59)

## 2023-06-20 LAB — LIPID PANEL WITH LDL/HDL RATIO
Cholesterol, Total: 181 mg/dL (ref 100–199)
HDL: 53 mg/dL (ref >39)
LDL Chol Calc (NIH): 109 mg/dL — ABNORMAL HIGH (ref 0–99)
LDL/HDL Ratio: 2.1 ratio (ref 0.0–3.2)
Triglycerides: 104 mg/dL (ref 0–149)
VLDL Cholesterol Cal: 19 mg/dL (ref 5–40)

## 2024-03-07 ENCOUNTER — Ambulatory Visit
